# Patient Record
Sex: Female | Born: 1985 | Race: Black or African American | Hispanic: No | Marital: Single | Smoking: Never smoker
Health system: Southern US, Community
[De-identification: ages and names within clinical notes are randomized; demographics above are authoritative.]

## PROBLEM LIST (undated history)

## (undated) ENCOUNTER — Inpatient Hospital Stay (HOSPITAL_COMMUNITY): Payer: Self-pay

## (undated) DIAGNOSIS — R002 Palpitations: Secondary | ICD-10-CM

## (undated) DIAGNOSIS — Z789 Other specified health status: Secondary | ICD-10-CM

## (undated) HISTORY — PX: NO PAST SURGERIES: SHX2092

## (undated) HISTORY — DX: Palpitations: R00.2

---
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
## 2009-11-06 ENCOUNTER — Emergency Department (HOSPITAL_COMMUNITY): Admission: EM | Admit: 2009-11-06 | Discharge: 2009-11-06 | Payer: Self-pay | Admitting: Emergency Medicine

## 2009-12-12 ENCOUNTER — Emergency Department (HOSPITAL_COMMUNITY): Admission: EM | Admit: 2009-12-12 | Discharge: 2009-12-12 | Payer: Self-pay | Admitting: Family Medicine

## 2009-12-21 ENCOUNTER — Encounter: Payer: Self-pay | Admitting: Cardiology

## 2009-12-21 ENCOUNTER — Ambulatory Visit: Payer: Self-pay

## 2009-12-21 ENCOUNTER — Ambulatory Visit (HOSPITAL_COMMUNITY): Admission: RE | Admit: 2009-12-21 | Discharge: 2009-12-21 | Payer: Self-pay | Admitting: Cardiology

## 2009-12-21 ENCOUNTER — Ambulatory Visit: Payer: Self-pay | Admitting: Cardiovascular Disease

## 2010-05-31 LAB — POCT I-STAT, CHEM 8
Calcium, Ion: 1.18 mmol/L (ref 1.12–1.32)
Creatinine, Ser: 0.8 mg/dL (ref 0.4–1.2)
Glucose, Bld: 93 mg/dL (ref 70–99)
HCT: 38 % (ref 36.0–46.0)
Hemoglobin: 12.9 g/dL (ref 12.0–15.0)
Potassium: 3.8 mEq/L (ref 3.5–5.1)

## 2011-02-15 ENCOUNTER — Emergency Department (INDEPENDENT_AMBULATORY_CARE_PROVIDER_SITE_OTHER)
Admission: EM | Admit: 2011-02-15 | Discharge: 2011-02-15 | Disposition: A | Discharge status: Home / Self Care | Payer: Self-pay | Source: Home / Self Care | Attending: Family Medicine | Admitting: Family Medicine

## 2011-02-15 DIAGNOSIS — R591 Generalized enlarged lymph nodes: Secondary | ICD-10-CM

## 2011-02-15 DIAGNOSIS — R599 Enlarged lymph nodes, unspecified: Secondary | ICD-10-CM

## 2011-02-15 LAB — DIFFERENTIAL
Eosinophils Absolute: 0.1 10*3/uL (ref 0.0–0.7)
Eosinophils Relative: 1 % (ref 0–5)
Lymphs Abs: 1.8 10*3/uL (ref 0.7–4.0)
Monocytes Absolute: 0.7 10*3/uL (ref 0.1–1.0)
Monocytes Relative: 7 % (ref 3–12)

## 2011-02-15 LAB — CBC
HCT: 34.9 % — ABNORMAL LOW (ref 36.0–46.0)
Hemoglobin: 11.8 g/dL — ABNORMAL LOW (ref 12.0–15.0)
MCH: 31.1 pg (ref 26.0–34.0)
MCV: 92.1 fL (ref 78.0–100.0)
RBC: 3.79 MIL/uL — ABNORMAL LOW (ref 3.87–5.11)

## 2011-02-15 NOTE — ED Provider Notes (Signed)
History     CSN: [id number] Arrival date & time: 02/15/2011  9:10 AM   First MD Initiated Contact with Patient 02/15/11 0901      Chief Complaint  Patient presents with  . Lymphadenopathy    (Consider location/radiation/quality/duration/timing/severity/associated sxs/prior treatment) HPI Comments: Meghan Holland presents for evaluation of a swollen lymph node on her anterior neck that she noticed incidentally. She denies any recent URI or other illness, has no allergies, has not been around anyone sick. She is sexually active with one partner, but does not use barrier protection consistently. She did some online research and is just concerned at this point. She adamantly denies any sx.    Patient is a 25 y.o. female presenting with general illness. The history is provided by the patient.  Illness  The onset is undetermined. The problem has been unchanged. The problem is mild. The symptoms are relieved by nothing. The symptoms are aggravated by nothing. Pertinent negatives include no fever, no congestion, no ear pain, no rhinorrhea, no sore throat, no swollen glands and no neck pain. There were no sick contacts.    History reviewed. No pertinent past medical history.  History reviewed. No pertinent past surgical history.  No family history on file.  History  Substance Use Topics  . Smoking status: Never Smoker   . Smokeless tobacco: Not on file  . Alcohol Use: Yes     occasional    OB History    Grav Para Term Preterm Abortions TAB SAB Ect Mult Living                  Review of Systems  Constitutional: Negative for fever, chills, activity change, appetite change and unexpected weight change.  HENT: Negative for ear pain, congestion, sore throat, rhinorrhea, sneezing, trouble swallowing, neck pain and neck stiffness.   Eyes: Negative.   Respiratory: Negative.   Cardiovascular: Negative.   Gastrointestinal: Negative.   Genitourinary: Negative.   Musculoskeletal: Negative.     Skin: Negative.   Neurological: Negative.     Allergies  Review of patient's allergies indicates no known allergies.  Home Medications  No current outpatient prescriptions on file.  BP 129/83  Pulse 84  Temp(Src) 98.6 F (37 C) (Oral)  Resp 16  SpO2 99%  LMP 01/27/2011  Physical Exam  Constitutional: She is oriented to person, place, and time. She appears well-developed and well-nourished.  HENT:  Head: Normocephalic and atraumatic.  Right Ear: External ear normal.  Left Ear: External ear normal.  Mouth/Throat: Oropharynx is clear and moist.  Eyes: EOM are normal. Pupils are equal, round, and reactive to light.  Neck: Normal range of motion.  Cardiovascular: Normal rate.   Pulmonary/Chest: Effort normal and breath sounds normal.  Lymphadenopathy:    She has cervical adenopathy.  Neurological: She is alert and oriented to person, place, and time.  Skin: Skin is warm and dry.    ED Course  Procedures (including critical care time)   Labs Reviewed  HIV ANTIBODY (ROUTINE TESTING)  RPR  CBC  DIFFERENTIAL   No results found.   No diagnosis found.    MDM          Richardo Priest, MD 02/15/11 1055

## 2011-02-15 NOTE — ED Notes (Signed)
C/o swollen lymph node rt side of neck for 1 week.  Denies recent cold, illness or fever.

## 2011-02-18 NOTE — ED Notes (Signed)
The patient came in for her lab results. Pt. verified with picture ID and shown her neg. HIV result. Pt. instructed to get retested in 6 mos.  The patient voiced understanding. Vassie Moselle 02/18/2011

## 2011-02-27 ENCOUNTER — Encounter (HOSPITAL_COMMUNITY): Payer: Self-pay | Admitting: *Deleted

## 2011-02-27 ENCOUNTER — Emergency Department (HOSPITAL_COMMUNITY)
Admission: EM | Admit: 2011-02-27 | Discharge: 2011-02-27 | Disposition: A | Discharge status: Home / Self Care | Payer: Self-pay | Attending: Emergency Medicine | Admitting: Emergency Medicine

## 2011-02-27 DIAGNOSIS — R202 Paresthesia of skin: Secondary | ICD-10-CM

## 2011-02-27 DIAGNOSIS — R599 Enlarged lymph nodes, unspecified: Secondary | ICD-10-CM | POA: Insufficient documentation

## 2011-02-27 DIAGNOSIS — R209 Unspecified disturbances of skin sensation: Secondary | ICD-10-CM | POA: Insufficient documentation

## 2011-02-27 NOTE — ED Provider Notes (Signed)
History     CSN: [id number] Arrival date & time: 02/27/2011  3:18 AM   First MD Initiated Contact with Patient 02/27/11 [phone]      Chief Complaint  Patient presents with  . Tingling    (Consider location/radiation/quality/duration/timing/severity/associated sxs/prior treatment) The history is provided by the patient.   patient is a 25 year old female past medical history is negative she is followed by Dr. Sudie Bailey with complaint of on and off bilateral tingling of both facial cheeks and bilateral tingling of upper extremities usually in concert denies any numbness motor weakness headache or other neurological findings. the symptoms are present only for one week.   Also, currently taking penicillin prescribed her by her dentist for what is believed to be a tooth infection in one of the right lower teeth also patient is known and as told that she has an enlarged lymph node in that area.  History reviewed. No pertinent past medical history.  History reviewed. No pertinent past surgical history.  History reviewed. No pertinent family history.  History  Substance Use Topics  . Smoking status: Never Smoker   . Smokeless tobacco: Not on file  . Alcohol Use: Yes     occasional    OB History    Grav Para Term Preterm Abortions TAB SAB Ect Mult Living                  Review of Systems  Constitutional: Negative for fever.  HENT: Positive for dental problem. Negative for ear pain, congestion, sore throat, facial swelling and neck pain.   Eyes: Negative for photophobia and visual disturbance.  Respiratory: Negative for cough and shortness of breath.   Cardiovascular: Negative for chest pain and leg swelling.  Gastrointestinal: Negative for nausea, vomiting and abdominal pain.  Genitourinary: Negative for dysuria, frequency and hematuria.  Musculoskeletal: Negative for back pain.  Skin: Negative for rash.  Neurological: Negative for dizziness, facial asymmetry, speech difficulty,  weakness, numbness and headaches.  Hematological: Positive for adenopathy.  Psychiatric/Behavioral: Negative for confusion.    Allergies  Review of patient's allergies indicates no known allergies.  Home Medications  No current outpatient prescriptions on file.  BP 137/77  Pulse 71  Temp 98.7 F (37.1 C)  Resp 18  SpO2 100%  LMP 02/23/2011  Physical Exam  Nursing note and vitals reviewed. Constitutional: She is oriented to person, place, and time. She appears well-developed and well-nourished. No distress.  HENT:  Head: Normocephalic and atraumatic.  Mouth/Throat: Oropharynx is clear and moist.       1 cm palpable nontender right submental lymph node.  Eyes: Conjunctivae and EOM are normal. Pupils are equal, round, and reactive to light. Right eye exhibits no discharge. Left eye exhibits no discharge.  Neck: Normal range of motion. Neck supple.  Cardiovascular: Normal rate, regular rhythm and normal heart sounds.   No murmur heard. Pulmonary/Chest: Effort normal and breath sounds normal. She has no wheezes.  Abdominal: Soft. Bowel sounds are normal. There is no tenderness.  Musculoskeletal: Normal range of motion. She exhibits no edema and no tenderness.  Lymphadenopathy:    She has cervical adenopathy.  Neurological: She is alert and oriented to person, place, and time. No cranial nerve deficit. She exhibits normal muscle tone. Coordination normal.  Skin: Skin is warm. No rash noted.    ED Course  Procedures (including critical care time)  Labs Reviewed - No data to display No results found.   1. Tingling of skin  MDM   The etiology of tingling to bilateral face and both upper extremities over the last week not specifically clear. Could represent early findings of MS. This is present only for a week we'll have patient followup with her primary care provider if symptoms resolve probably not a specific concern if they reoccur or their new or worse may need  workup for MS or other evaluation.         Shelda Jakes, MD 02/27/11 [phone]

## 2011-02-27 NOTE — ED Notes (Addendum)
Pt reports tingling sensation in arms. Denies any numbness.  States "They just feel weird." Denies any injury.  Non specific on time symptoms started.  Stated "maybe last Thursday."

## 2011-03-10 ENCOUNTER — Emergency Department (HOSPITAL_COMMUNITY)
Admission: EM | Admit: 2011-03-10 | Discharge: 2011-03-10 | Disposition: A | Discharge status: Home / Self Care | Payer: Self-pay | Attending: Emergency Medicine | Admitting: Emergency Medicine

## 2011-03-10 ENCOUNTER — Encounter (HOSPITAL_COMMUNITY): Payer: Self-pay | Admitting: *Deleted

## 2011-03-10 DIAGNOSIS — M7989 Other specified soft tissue disorders: Secondary | ICD-10-CM | POA: Insufficient documentation

## 2011-03-10 DIAGNOSIS — D179 Benign lipomatous neoplasm, unspecified: Secondary | ICD-10-CM | POA: Insufficient documentation

## 2011-03-10 NOTE — ED Notes (Signed)
Pt c/o bilateral swelling to arms and legs since Monday.

## 2011-03-10 NOTE — ED Provider Notes (Signed)
History     CSN: [id number]  Arrival date & time 03/10/11  0147   First MD Initiated Contact with Patient 03/10/11 0239      Chief Complaint  Patient presents with  . Leg Swelling  . Arm Swelling    (Consider location/radiation/quality/duration/timing/severity/associated sxs/prior treatment) HPI Comments: Meghan Holland is a 25 y.o. female who presents to the Emergency Department complaining of swollen area on her legs and arm that hs has noticed over the last few days. She does not know how long she has had them. There are several to her right leg and one to her left leg. They are non tender. Denies fever, chills, shortness of breath chest pain. She is also here with intermittent numbness and tingling that she is experiencing for several weeks to her arms and legs. She was seen for a similar complaint and referred to her PCP, Dr. Sudie Bailey for follow up and further work up.   History reviewed. No pertinent past medical history.  History reviewed. No pertinent past surgical history.  History reviewed. No pertinent family history.  History  Substance Use Topics  . Smoking status: Never Smoker   . Smokeless tobacco: Not on file  . Alcohol Use: Yes     occasional    OB History    Grav Para Term Preterm Abortions TAB SAB Ect Mult Living                  Review of Systems 10 Systems reviewed and are negative for acute change except as noted in the HPI. Allergies  Review of patient's allergies indicates no known allergies.  Home Medications  No current outpatient prescriptions on file.  BP 129/78  Pulse 77  Temp 98.2 F (36.8 C)  Resp 20  Ht 5\' 8"  (1.727 m)  Wt 178 lb (80.74 kg)  BMI 27.06 kg/m2  SpO2 100%  LMP 02/23/2011  Physical Exam  Nursing note and vitals reviewed. Constitutional: She is oriented to person, place, and time. She appears well-developed and well-nourished. No distress.  HENT:  Head: Normocephalic and atraumatic.  Right Ear: External ear  normal.  Left Ear: External ear normal.  Mouth/Throat: Oropharynx is clear and moist.  Eyes: Conjunctivae and EOM are normal. Pupils are equal, round, and reactive to light.  Neck: Normal range of motion. No thyromegaly present.  Cardiovascular: Normal rate, normal heart sounds and intact distal pulses.   Pulmonary/Chest: Effort normal and breath sounds normal.  Abdominal: Soft.  Musculoskeletal: Normal range of motion.  Lymphadenopathy:    She has no cervical adenopathy.  Neurological: She is alert and oriented to person, place, and time. She has normal reflexes. No cranial nerve deficit. Coordination normal.  Skin:       palpaple asily ovable soft tissue masses < 1 cm to right thigh and     ED Course  Procedures (including critical care time)  Labs Reviewed - No data to display No results found.   1. Lipoma       MDM  Patient with h/o numbness and tingling of arms and legs that is under investigation by PCP. Here with continued intermittent numbness and tingling as well as noticing several areas to the top of her right thigh that are c/w lipomas. She will follow up with PCP, Dr. Sudie Bailey. Pt stable in ED with no significant deterioration in condition.The patient appears reasonably screened and/or stabilized for discharge and I doubt any other medical condition or other [facility] requiring further screening, evaluation, or  treatment in the ED at this time prior to discharge.  MDM Reviewed: previous chart, nursing note and vitals Reviewed previous: x-ray           Nicoletta Dress. Colon Branch, MD 03/13/11 2122

## 2011-03-22 ENCOUNTER — Emergency Department (INDEPENDENT_AMBULATORY_CARE_PROVIDER_SITE_OTHER)
Admission: EM | Admit: 2011-03-22 | Discharge: 2011-03-22 | Disposition: A | Discharge status: Home / Self Care | Payer: Self-pay | Source: Home / Self Care | Attending: Family Medicine | Admitting: Family Medicine

## 2011-03-22 ENCOUNTER — Other Ambulatory Visit: Payer: Self-pay

## 2011-03-22 ENCOUNTER — Encounter (HOSPITAL_COMMUNITY): Payer: Self-pay

## 2011-03-22 DIAGNOSIS — R002 Palpitations: Secondary | ICD-10-CM

## 2011-03-22 LAB — POCT I-STAT, CHEM 8
Creatinine, Ser: 0.9 mg/dL (ref 0.50–1.10)
Glucose, Bld: 90 mg/dL (ref 70–99)
Hemoglobin: 12.9 g/dL (ref 12.0–15.0)
TCO2: 24 mmol/L (ref 0–100)

## 2011-03-22 NOTE — ED Provider Notes (Signed)
History     CSN: [id number]  Arrival date & time 03/22/11  1029   First MD Initiated Contact with Patient 03/22/11 1111      Chief Complaint  Patient presents with  . Palpitations    (Consider location/radiation/quality/duration/timing/severity/associated sxs/prior treatment) HPI Comments: Meghan Holland presents for evaluation of increased frequency of heart palpitations over the last week. She reports a long hx of this and has seen a cardiologist in the past, with a reportedly negative workup including an echocardiogram. She denies any chest pain, shortness of breath, numbness, weakness, or vision changes.   Patient is a 26 y.o. female presenting with palpitations. The history is provided by the patient.  Palpitations  This is a chronic problem. The current episode started more than 1 week ago. The problem occurs constantly. The problem has been gradually worsening. The problem is associated with an unknown factor. Associated symptoms include irregular heartbeat. Pertinent negatives include no malaise/fatigue, no numbness, no chest pain, no chest pressure, no claudication, no exertional chest pressure, no near-syncope, no syncope, no nausea, no vomiting, no headaches, no lower extremity edema, no weakness and no shortness of breath. She has tried nothing for the symptoms. Risk factors include no known risk factors.    Past Medical History  Diagnosis Date  . Palpitations     negative echo     History reviewed. No pertinent past surgical history.  Family History  Problem Relation Age of Onset  . Hypertension Mother   . Hypertension Sister   . Diabetes Maternal Grandmother     History  Substance Use Topics  . Smoking status: Never Smoker   . Smokeless tobacco: Not on file  . Alcohol Use: Yes     occasional    OB History    Grav Para Term Preterm Abortions TAB SAB Ect Mult Living                  Review of Systems  Constitutional: Negative for malaise/fatigue.  Respiratory:  Negative for shortness of breath.   Cardiovascular: Positive for palpitations. Negative for chest pain, claudication, syncope and near-syncope.  Gastrointestinal: Negative for nausea and vomiting.  Neurological: Negative for weakness, numbness and headaches.    Allergies  Review of patient's allergies indicates no known allergies.  Home Medications  No current outpatient prescriptions on file.  BP 129/74  Pulse 72  Temp(Src) 98.4 F (36.9 C) (Oral)  Resp 18  SpO2 100%  LMP 03/21/2011  Physical Exam  Nursing note and vitals reviewed. Constitutional: She is oriented to person, place, and time. She appears well-developed and well-nourished.  HENT:  Head: Normocephalic and atraumatic.  Eyes: EOM are normal. Pupils are equal, round, and reactive to light.  Neck: Normal range of motion. Neck supple.  Cardiovascular: Normal rate, regular rhythm, normal heart sounds and normal pulses.   No murmur heard.      ECG: NSR, rate 62 bpm, no ST or T wave changes; normal PR and QT intervals  Pulmonary/Chest: Effort normal and breath sounds normal. She has no wheezes. She has no rhonchi.  Musculoskeletal: Normal range of motion.  Neurological: She is alert and oriented to person, place, and time.  Skin: Skin is warm and dry.  Psychiatric: Her behavior is normal.    ED Course  Procedures (including critical care time)  Labs Reviewed  POCT I-STAT, CHEM 8 - Abnormal; Notable for the following:    Potassium 3.4 (*)    All other components within normal limits  LAB REPORT -  SCANNED   No results found.   1. Heart palpitations       MDM  ECG: NSR, rate 62 bpm, no ST or T wave changes; normal PR and QT intervals; referred back to PCP and/or cardiologist; no intervention at this time        Richardo Priest, MD 04/20/11 [phone]

## 2011-03-22 NOTE — ED Notes (Signed)
Patient state she has been evaluated previously for palpitations ~ 1 yr ago by cardiologist , and reportedly had a negative cardiac echo . States she has palpitations off and on all the time. FOR PAST WEEK , SHE HAS HAD an increase in frequency of her palpitations  Concerned there is a heart problem, although she denies pain in chest radiation of any pain, sweating, n/v .concerned about discomfort in her right post knee area and poss. of congestive heart issues, no swelling on inspection of tibial prominance, also c/o numbness in her right leg

## 2011-03-26 ENCOUNTER — Encounter: Payer: Self-pay | Admitting: Nurse Practitioner

## 2011-03-26 ENCOUNTER — Ambulatory Visit (INDEPENDENT_AMBULATORY_CARE_PROVIDER_SITE_OTHER): Payer: Self-pay | Admitting: Nurse Practitioner

## 2011-03-26 ENCOUNTER — Telehealth: Payer: Self-pay | Admitting: *Deleted

## 2011-03-26 VITALS — BP 128/96 | HR 68 | Ht 67.0 in | Wt 178.0 lb

## 2011-03-26 DIAGNOSIS — R002 Palpitations: Secondary | ICD-10-CM | POA: Insufficient documentation

## 2011-03-26 DIAGNOSIS — E876 Hypokalemia: Secondary | ICD-10-CM

## 2011-03-26 LAB — BASIC METABOLIC PANEL
BUN: 8 mg/dL (ref 6–23)
CO2: 24 mEq/L (ref 19–32)
Calcium: 8.3 mg/dL — ABNORMAL LOW (ref 8.4–10.5)
Chloride: 108 mEq/L (ref 96–112)
Creatinine, Ser: 0.8 mg/dL (ref 0.4–1.2)
GFR: 112.55 mL/min (ref >60.00)
Glucose, Bld: 89 mg/dL (ref 70–99)
Potassium: 3.4 mEq/L — ABNORMAL LOW (ref 3.5–5.1)
Sodium: 139 mEq/L (ref 135–145)

## 2011-03-26 LAB — TSH: TSH: 1.02 u[IU]/mL (ref 0.35–5.50)

## 2011-03-26 NOTE — Assessment & Plan Note (Addendum)
We will place an event monitor. For now I have not given her any medicine to try. Would like to try and document a spell. Her echo was in 2011 and I do not think we need to update at this point. We will recheck her labs today. I will see her back in a month. She is to remain off of caffeine. Patient is agreeable to this plan and will call if any problems develop in the interim.

## 2011-03-26 NOTE — Patient Instructions (Signed)
We are going to recheck your labs today.  We are going to put a monitor on to look at your heart rhythm for the next month  I will see you in a month.  Call the [facility] office at [phone] if you have any questions, problems or concerns.

## 2011-03-26 NOTE — Progress Notes (Signed)
Addended by: Deveron Furlong on: 03/26/2011 02:21 PM   Modules accepted: Orders

## 2011-03-26 NOTE — Progress Notes (Signed)
   Meghan Holland Date of Birth: 19-Jun-1985 Medical Record #[id number]  History of Present Illness: Ms. Meghan Holland is seen today for a work in visit. She was formerly seen by Dr. Deborah Holland. She has had a past history of palpitations. She had a normal echo in October of 2011. She presents today because she was at the Urgent Care earlier this month. She had had worsening palpitations with an increase in the frequency. She was concerned that she had a heart problem. Her EKG was normal. She was told to follow up here. Her potassium was just slightly low at 3.4.   She comes in today. She still notes palpitations. She thinks something is wrong with her. Notes that she gets very dizzy and feels like she is going to pass out. Has tried to cut back her caffeine but doers eat chocolate. No frank syncope. No medical problems reported. She in on no medicines. Only occasional alcohol use.    No current outpatient prescriptions on file prior to visit.    No Known Allergies  Past Medical History  Diagnosis Date  . Palpitations     negative echo     No past surgical history on file.  History  Smoking status  . Never Smoker   Smokeless tobacco  . Not on file    History  Alcohol Use  . Yes    occasional    Family History  Problem Relation Age of Onset  . Hypertension Mother   . Hypertension Sister   . Diabetes Maternal Grandmother     Review of Systems: The review of systems is per the HPI.  All other systems were reviewed and are negative.  Physical Exam: BP 128/96  Pulse 68  Ht 5\' 7"  (1.702 m)  Wt 178 lb (80.74 kg)  BMI 27.88 kg/m2  LMP 03/21/2011 Patient is very pleasant and in no acute distress. Skin is warm and dry. Color is normal.  HEENT is unremarkable. Normocephalic/atraumatic. PERRL. Sclera are nonicteric. Neck is supple. No masses. No JVD. Lungs are clear. Cardiac exam shows a regular rate and rhythm. Abdomen is soft. Extremities are without edema. Gait and ROM are intact. No  gross neurologic deficits noted.    Lab Results  Component Value Date   WBC 9.5 02/15/2011   HGB 12.9 03/22/2011   HCT 38.0 03/22/2011   PLT 332 02/15/2011   GLUCOSE 90 03/22/2011   NA 140 03/22/2011   K 3.4* 03/22/2011   CL 105 03/22/2011   CREATININE 0.90 03/22/2011   BUN 6 03/22/2011   TSH 1.174 12/12/2009     Assessment / Plan:

## 2011-03-27 ENCOUNTER — Telehealth: Payer: Self-pay | Admitting: *Deleted

## 2011-03-27 DIAGNOSIS — R42 Dizziness and giddiness: Secondary | ICD-10-CM

## 2011-03-27 NOTE — Telephone Encounter (Signed)
FU Call: Pt returning call from our office from yesterday.   Pt also wants to be assigned to a MD, as suggested by Lori--pt is former [place] pt. Please return pt call to discuss further.

## 2011-03-27 NOTE — Telephone Encounter (Signed)
03/27/11--pt calling stating she is having very bad headaches and would like to see a neurologist--advised to call PCP for referral to neurologist--pt agrees--nt

## 2011-04-02 ENCOUNTER — Other Ambulatory Visit (INDEPENDENT_AMBULATORY_CARE_PROVIDER_SITE_OTHER): Payer: Self-pay | Admitting: *Deleted

## 2011-04-02 DIAGNOSIS — E876 Hypokalemia: Secondary | ICD-10-CM

## 2011-04-02 LAB — BASIC METABOLIC PANEL
BUN: 9 mg/dL (ref 6–23)
CO2: 26 mEq/L (ref 19–32)
Calcium: 8.8 mg/dL (ref 8.4–10.5)
Chloride: 106 mEq/L (ref 96–112)
Creatinine, Ser: 0.8 mg/dL (ref 0.4–1.2)
GFR: 115.86 mL/min (ref >60.00)
Glucose, Bld: 71 mg/dL (ref 70–99)
Potassium: 3.7 mEq/L (ref 3.5–5.1)
Sodium: 139 mEq/L (ref 135–145)

## 2011-04-04 ENCOUNTER — Telehealth: Payer: Self-pay | Admitting: Nurse Practitioner

## 2011-04-04 NOTE — Telephone Encounter (Signed)
Pt called labs satisfactory

## 2011-04-04 NOTE — Telephone Encounter (Signed)
Fu call °Patient returning your call about lab results  °

## 2011-04-26 ENCOUNTER — Ambulatory Visit: Payer: Self-pay | Admitting: Nurse Practitioner

## 2013-08-26 LAB — OB RESULTS CONSOLE GC/CHLAMYDIA
CHLAMYDIA, DNA PROBE: POSITIVE
GC PROBE AMP, GENITAL: NEGATIVE

## 2013-08-26 LAB — OB RESULTS CONSOLE RPR: RPR: NONREACTIVE

## 2013-08-28 ENCOUNTER — Inpatient Hospital Stay (HOSPITAL_COMMUNITY)
Admission: AD | Admit: 2013-08-28 | Discharge: 2013-08-28 | Disposition: A | Discharge status: Home / Self Care | Payer: Medicaid Other | Source: Ambulatory Visit | Attending: Obstetrics and Gynecology | Admitting: Obstetrics and Gynecology

## 2013-08-28 ENCOUNTER — Encounter (HOSPITAL_COMMUNITY): Payer: Self-pay | Admitting: *Deleted

## 2013-08-28 DIAGNOSIS — R1032 Left lower quadrant pain: Secondary | ICD-10-CM | POA: Insufficient documentation

## 2013-08-28 DIAGNOSIS — O2390 Unspecified genitourinary tract infection in pregnancy, unspecified trimester: Secondary | ICD-10-CM | POA: Insufficient documentation

## 2013-08-28 DIAGNOSIS — N390 Urinary tract infection, site not specified: Secondary | ICD-10-CM

## 2013-08-28 HISTORY — DX: Other specified health status: Z78.9

## 2013-08-28 LAB — URINALYSIS, ROUTINE W REFLEX MICROSCOPIC
BILIRUBIN URINE: NEGATIVE
GLUCOSE, UA: NEGATIVE mg/dL
Ketones, ur: 15 mg/dL — AB
NITRITE: NEGATIVE
PH: 6.5 (ref 5.0–8.0)
Protein, ur: NEGATIVE mg/dL
Urobilinogen, UA: 0.2 mg/dL (ref 0.0–1.0)

## 2013-08-28 LAB — URINE MICROSCOPIC-ADD ON

## 2013-08-28 MED ORDER — SULFAMETHOXAZOLE-TRIMETHOPRIM 800-160 MG PO TABS: 1.0000 | ORAL_TABLET | Freq: Two times a day (BID) | ORAL | Status: AC

## 2013-08-28 NOTE — Discharge Instructions (Signed)
Asymptomatic Bacteriuria, Female Asymptomatic bacteriuria is a significant number of bacteria in your urine that occur without the usual symptoms of burning or frequent urination. The following conditions increase risk of asymptomatic bacteriuria:  Diabetes mellitus.  Advanced age.  Pregnancy in the first trimester.  Kidney stones.  Kidney transplants.  Leaky kidney tube valve in young children (reflux). Treatment for asymptomatic bacteriuria is not required in most people and can lead to other problems such as yeast overgrowth and development of resistant bacteria. However, some people, such as pregnant women, do need treatment to prevent kidney infection. Asymptomatic bacteriuria in pregnancy is also associated with fetal growth restriction, premature labor, and newborn death. HOME CARE INSTRUCTIONS Monitor your bacteriuria for any changes. The following actions may help to alleviate any discomfort you are experiencing:  Drink enough water and fluids to keep your urine clear or pale yellow. Go to the bathroom more frequently to keep your bladder empty.  Keep the area around your vagina and rectum clean. Wipe yourself from front to back after urinating. SEEK IMMEDIATE MEDICAL CARE IF:  You develop signs of an infection such asburning with urination, frequency of voiding, back pain, or fever.  You have blood in the urine.  You develop a fever. Document Released: 03/04/2005 Document Revised: 11/04/2012 Document Reviewed: 08/24/2012 [employer] Patient Information 2014 [employer].  

## 2013-08-28 NOTE — MAU Provider Note (Signed)
History     CSN: [id number]  Arrival date and time: 08/28/13 2007   First Provider Initiated Contact with Patient 08/28/13 2028      Chief Complaint  Patient presents with  . Flank Pain   HPI Ms. Meghan Holland is a 28 y.o. G1P0 at [redacted]w[redacted]d who presents to MAU today with complaint of left sided flank pain since this morning. She states that the pain has been constant and aching. She states that it is worse with sitting, but otherwise unchanged. She also endorses radiation of pain to the LLQ. She denies UTI symptoms, hematuria, vaginal bleeding, discharge, LOF, fever, N/V/D or constipation. She states possible irregular contractions. She feels that fetal movement may be slightly decreased today.    OB History   Grav Para Term Preterm Abortions TAB SAB Ect Mult Living   1               Past Medical History  Diagnosis Date  . Palpitations     negative echo   . Medical history non-contributory     Past Surgical History  Procedure Laterality Date  . No past surgeries      Family History  Problem Relation Age of Onset  . Hypertension Mother   . Hypertension Sister   . Diabetes Maternal Grandmother     History  Substance Use Topics  . Smoking status: Never Smoker   . Smokeless tobacco: Not on file  . Alcohol Use: Yes     Comment: occasional    Allergies: No Known Allergies  Prescriptions prior to admission  Medication Sig Dispense Refill  . calcium carbonate (TUMS - DOSED IN MG ELEMENTAL CALCIUM) 500 MG chewable tablet Chew 1 tablet by mouth daily as needed for heartburn.      . Prenatal Vit-Fe Fumarate-FA (PRENATAL MULTIVITAMIN) TABS tablet Take 1 tablet by mouth daily at 12 noon.        Review of Systems  Constitutional: Negative for fever.  Gastrointestinal: Positive for abdominal pain. Negative for nausea, vomiting, diarrhea and constipation.  Genitourinary: Positive for flank pain. Negative for dysuria, urgency, frequency and hematuria.       Neg - vaginal  bleeding, discharge, LOF   Physical Exam   Blood pressure 132/72, pulse 97, temperature 98.3 F (36.8 C), temperature source Oral, resp. rate 18.  Physical Exam  Constitutional: She is oriented to person, place, and time. She appears well-developed and well-nourished. No distress.  HENT:  Head: Normocephalic.  Cardiovascular: Normal rate, regular rhythm and normal heart sounds.   Respiratory: Effort normal and breath sounds normal. No respiratory distress.  GI: Soft. She exhibits no distension and no mass. There is no tenderness. There is no rebound, no guarding and no CVA tenderness.  Musculoskeletal:       Thoracic back: She exhibits no tenderness, no bony tenderness, no swelling and no edema.       Lumbar back: She exhibits no tenderness, no swelling and no edema.  Neurological: She is alert and oriented to person, place, and time.  Skin: Skin is warm and dry. No erythema.  Psychiatric: She has a normal mood and affect.   Results for orders placed during the hospital encounter of 08/28/13 (from the past 24 hour(s))  URINALYSIS, ROUTINE W REFLEX MICROSCOPIC     Status: Abnormal   Collection Time    08/28/13  8:20 PM      Result Value Ref Range   Color, Urine YELLOW  YELLOW   APPearance CLEAR  CLEAR   Specific Gravity, Urine <1.005 (*) 1.005 - 1.030   pH 6.5  5.0 - 8.0   Glucose, UA NEGATIVE  NEGATIVE mg/dL   Hgb urine dipstick TRACE (*) NEGATIVE   Bilirubin Urine NEGATIVE  NEGATIVE   Ketones, ur 15 (*) NEGATIVE mg/dL   Protein, ur NEGATIVE  NEGATIVE mg/dL   Urobilinogen, UA 0.2  0.0 - 1.0 mg/dL   Nitrite NEGATIVE  NEGATIVE   Leukocytes, UA LARGE (*) NEGATIVE  URINE MICROSCOPIC-ADD ON     Status: Abnormal   Collection Time    08/28/13  8:20 PM      Result Value Ref Range   Squamous Epithelial / LPF FEW (*) RARE   WBC, UA 11-20  <3 WBC/hpf   Bacteria, UA FEW (*) RARE   Urine-Other RARE YEAST      Fetal Monitoring: Baseline: 155 bpm, moderate variability, +  accelerations, no decelerations Contractions: none  MAU Course  Procedures None  MDM Discussed with Dr. Willis Modena. Treat for possible UTI with Bactrim DS x 3 days. Follow-up as scheduled or sooner if symptoms worsen  Assessment and Plan  A: SIUP at [redacted]w[redacted]d UTI  P: Discharge home Rx for Bactrim given to patient Patient advised to follow-up with [facility] as scheduled or sooner if symptoms persist or worsen Patient may return to MAU as needed or if her condition were to change or worsen  Farris Has, PA-C  08/28/2013, 9:06 PM

## 2013-08-28 NOTE — MAU Note (Addendum)
Left flank pain that wraps around to left mid abdomen since this morning. Constant. Denies dysuria. Denies vaginal bleeding or LOF. Decreased fetal movement today.

## 2013-08-28 NOTE — MAU Note (Signed)
L back pain off and on few days but much worse today. Denies bleeding or leaking fld

## 2013-08-29 NOTE — Progress Notes (Signed)
FHT from 6-13 reviewed.  Reactive NST, no significant decels, no regular ctx.

## 2013-10-26 ENCOUNTER — Other Ambulatory Visit: Payer: Self-pay | Admitting: Obstetrics and Gynecology

## 2013-10-26 ENCOUNTER — Telehealth (HOSPITAL_COMMUNITY): Payer: Self-pay | Admitting: *Deleted

## 2013-10-26 LAB — OB RESULTS CONSOLE GBS: GBS: POSITIVE

## 2013-10-26 NOTE — Telephone Encounter (Signed)
Preadmission screen  

## 2013-10-27 ENCOUNTER — Inpatient Hospital Stay (HOSPITAL_COMMUNITY)
Admission: RE | Admit: 2013-10-27 | Discharge: 2013-10-29 | DRG: 775 | Disposition: A | Discharge status: Home / Self Care | Payer: Medicaid Other | Source: Ambulatory Visit | Attending: Obstetrics and Gynecology | Admitting: Obstetrics and Gynecology

## 2013-10-27 ENCOUNTER — Encounter (HOSPITAL_COMMUNITY): Payer: Self-pay

## 2013-10-27 ENCOUNTER — Inpatient Hospital Stay (HOSPITAL_COMMUNITY): Payer: Medicaid Other | Admitting: Anesthesiology

## 2013-10-27 ENCOUNTER — Encounter (HOSPITAL_COMMUNITY): Payer: Medicaid Other | Admitting: Anesthesiology

## 2013-10-27 VITALS — BP 118/65 | HR 76 | Temp 98.2°F | Resp 20 | Ht 69.0 in | Wt 199.8 lb

## 2013-10-27 DIAGNOSIS — O365990 Maternal care for other known or suspected poor fetal growth, unspecified trimester, not applicable or unspecified: Secondary | ICD-10-CM | POA: Diagnosis present

## 2013-10-27 DIAGNOSIS — Z833 Family history of diabetes mellitus: Secondary | ICD-10-CM | POA: Diagnosis not present

## 2013-10-27 DIAGNOSIS — Z803 Family history of malignant neoplasm of breast: Secondary | ICD-10-CM | POA: Diagnosis not present

## 2013-10-27 DIAGNOSIS — O99892 Other specified diseases and conditions complicating childbirth: Secondary | ICD-10-CM | POA: Diagnosis present

## 2013-10-27 DIAGNOSIS — Z3490 Encounter for supervision of normal pregnancy, unspecified, unspecified trimester: Secondary | ICD-10-CM

## 2013-10-27 DIAGNOSIS — Z22330 Carrier of Group B streptococcus: Secondary | ICD-10-CM

## 2013-10-27 DIAGNOSIS — O99891 Other specified diseases and conditions complicating pregnancy: Secondary | ICD-10-CM | POA: Diagnosis present

## 2013-10-27 DIAGNOSIS — O9989 Other specified diseases and conditions complicating pregnancy, childbirth and the puerperium: Secondary | ICD-10-CM

## 2013-10-27 DIAGNOSIS — R002 Palpitations: Secondary | ICD-10-CM

## 2013-10-27 LAB — SAMPLE TO BLOOD BANK

## 2013-10-27 LAB — OB RESULTS CONSOLE ANTIBODY SCREEN: Antibody Screen: NEGATIVE

## 2013-10-27 LAB — CBC
HEMATOCRIT: 33 % — AB (ref 36.0–46.0)
Hemoglobin: 11.3 g/dL — ABNORMAL LOW (ref 12.0–15.0)
MCH: 31.8 pg (ref 26.0–34.0)
MCHC: 34.2 g/dL (ref 30.0–36.0)
MCV: 93 fL (ref 78.0–100.0)
Platelets: 316 10*3/uL (ref 150–400)
RBC: 3.55 MIL/uL — AB (ref 3.87–5.11)
RDW: 13 % (ref 11.5–15.5)
WBC: 13.3 10*3/uL — AB (ref 4.0–10.5)

## 2013-10-27 LAB — OB RESULTS CONSOLE RUBELLA ANTIBODY, IGM: Rubella: IMMUNE

## 2013-10-27 LAB — RPR

## 2013-10-27 LAB — OB RESULTS CONSOLE ABO/RH: RH TYPE: POSITIVE

## 2013-10-27 LAB — OB RESULTS CONSOLE HEPATITIS B SURFACE ANTIGEN: Hepatitis B Surface Ag: NEGATIVE

## 2013-10-27 LAB — OB RESULTS CONSOLE HIV ANTIBODY (ROUTINE TESTING): HIV: NONREACTIVE

## 2013-10-27 MED ORDER — LACTATED RINGERS IV SOLN
500.0000 mL | INTRAVENOUS | Status: DC | PRN
  Administered 2013-10-27: 500 mL via INTRAVENOUS

## 2013-10-27 MED ORDER — PENICILLIN G POTASSIUM 5000000 UNITS IJ SOLR
5.0000 10*6.[IU] | Freq: Once | INTRAMUSCULAR | Status: AC
  Administered 2013-10-27: 5 10*6.[IU] via INTRAVENOUS
  Filled 2013-10-27: qty 5

## 2013-10-27 MED ORDER — DIPHENHYDRAMINE HCL 50 MG/ML IJ SOLN: 12.5000 mg | INTRAMUSCULAR | Status: DC | PRN

## 2013-10-27 MED ORDER — BENZOCAINE-MENTHOL 20-0.5 % EX AERO
1.0000 "application " | INHALATION_SPRAY | CUTANEOUS | Status: DC | PRN
  Administered 2013-10-28: 1 via TOPICAL
  Filled 2013-10-27: qty 56

## 2013-10-27 MED ORDER — ONDANSETRON HCL 4 MG PO TABS: 4.0000 mg | ORAL_TABLET | ORAL | Status: DC | PRN

## 2013-10-27 MED ORDER — WITCH HAZEL-GLYCERIN EX PADS: 1.0000 "application " | MEDICATED_PAD | CUTANEOUS | Status: DC | PRN

## 2013-10-27 MED ORDER — OXYTOCIN 40 UNITS IN LACTATED RINGERS INFUSION - SIMPLE MED
1.0000 m[IU]/min | INTRAVENOUS | Status: DC
  Administered 2013-10-27: 12 m[IU]/min via INTRAVENOUS
  Administered 2013-10-27: 2 m[IU]/min via INTRAVENOUS
  Filled 2013-10-27: qty 1000

## 2013-10-27 MED ORDER — OXYCODONE-ACETAMINOPHEN 5-325 MG PO TABS: 1.0000 | ORAL_TABLET | ORAL | Status: DC | PRN

## 2013-10-27 MED ORDER — DIPHENHYDRAMINE HCL 25 MG PO CAPS: 25.0000 mg | ORAL_CAPSULE | Freq: Four times a day (QID) | ORAL | Status: DC | PRN

## 2013-10-27 MED ORDER — IBUPROFEN 600 MG PO TABS
600.0000 mg | ORAL_TABLET | Freq: Four times a day (QID) | ORAL | Status: DC
  Administered 2013-10-27 – 2013-10-29 (×7): 600 mg via ORAL
  Filled 2013-10-27 (×6): qty 1

## 2013-10-27 MED ORDER — MEASLES, MUMPS & RUBELLA VAC ~~LOC~~ INJ
0.5000 mL | INJECTION | Freq: Once | SUBCUTANEOUS | Status: DC
  Filled 2013-10-27: qty 0.5

## 2013-10-27 MED ORDER — TETANUS-DIPHTH-ACELL PERTUSSIS 5-2.5-18.5 LF-MCG/0.5 IM SUSP
0.5000 mL | Freq: Once | INTRAMUSCULAR | Status: AC
  Administered 2013-10-28: 0.5 mL via INTRAMUSCULAR

## 2013-10-27 MED ORDER — LIDOCAINE HCL (PF) 1 % IJ SOLN
30.0000 mL | INTRAMUSCULAR | Status: AC | PRN
  Administered 2013-10-27: 30 mL via SUBCUTANEOUS
  Filled 2013-10-27: qty 30

## 2013-10-27 MED ORDER — DIBUCAINE 1 % RE OINT: 1.0000 "application " | TOPICAL_OINTMENT | RECTAL | Status: DC | PRN

## 2013-10-27 MED ORDER — FERROUS FUMARATE 325 (106 FE) MG PO TABS
1.0000 | ORAL_TABLET | Freq: Every day | ORAL | Status: DC
  Administered 2013-10-28: 106 mg via ORAL
  Filled 2013-10-27 (×3): qty 1

## 2013-10-27 MED ORDER — SENNOSIDES-DOCUSATE SODIUM 8.6-50 MG PO TABS
2.0000 | ORAL_TABLET | ORAL | Status: DC
  Administered 2013-10-28 (×2): 2 via ORAL
  Filled 2013-10-27: qty 2

## 2013-10-27 MED ORDER — PENICILLIN G POTASSIUM 5000000 UNITS IJ SOLR
2.5000 10*6.[IU] | INTRAVENOUS | Status: DC
  Administered 2013-10-27: 2.5 10*6.[IU] via INTRAVENOUS
  Filled 2013-10-27 (×5): qty 2.5

## 2013-10-27 MED ORDER — SIMETHICONE 80 MG PO CHEW: 80.0000 mg | CHEWABLE_TABLET | ORAL | Status: DC | PRN

## 2013-10-27 MED ORDER — LACTATED RINGERS IV SOLN: 500.0000 mL | Freq: Once | INTRAVENOUS | Status: DC

## 2013-10-27 MED ORDER — ONDANSETRON HCL 4 MG/2ML IJ SOLN: 4.0000 mg | INTRAMUSCULAR | Status: DC | PRN

## 2013-10-27 MED ORDER — EPHEDRINE 5 MG/ML INJ
10.0000 mg | INTRAVENOUS | Status: DC | PRN
  Filled 2013-10-27: qty 2

## 2013-10-27 MED ORDER — LACTATED RINGERS IV SOLN: INTRAVENOUS | Status: AC

## 2013-10-27 MED ORDER — OXYTOCIN 40 UNITS IN LACTATED RINGERS INFUSION - SIMPLE MED: 62.5000 mL/h | INTRAVENOUS | Status: AC | PRN

## 2013-10-27 MED ORDER — PRENATAL MULTIVITAMIN CH
1.0000 | ORAL_TABLET | Freq: Every day | ORAL | Status: DC
  Administered 2013-10-28: 1 via ORAL

## 2013-10-27 MED ORDER — IBUPROFEN 600 MG PO TABS: 600.0000 mg | ORAL_TABLET | Freq: Four times a day (QID) | ORAL | Status: DC | PRN

## 2013-10-27 MED ORDER — LIDOCAINE HCL (PF) 1 % IJ SOLN
INTRAMUSCULAR | Status: DC | PRN
  Administered 2013-10-27: 8 mL
  Administered 2013-10-27: 9 mL

## 2013-10-27 MED ORDER — LACTATED RINGERS IV SOLN
INTRAVENOUS | Status: DC
  Administered 2013-10-27 (×2): via INTRAVENOUS

## 2013-10-27 MED ORDER — ZOLPIDEM TARTRATE 5 MG PO TABS: 5.0000 mg | ORAL_TABLET | Freq: Every evening | ORAL | Status: DC | PRN

## 2013-10-27 MED ORDER — PRENATAL MULTIVITAMIN CH
1.0000 | ORAL_TABLET | Freq: Every day | ORAL | Status: DC
  Filled 2013-10-27: qty 1

## 2013-10-27 MED ORDER — FENTANYL 2.5 MCG/ML BUPIVACAINE 1/10 % EPIDURAL INFUSION (WH - ANES)
INTRAMUSCULAR | Status: DC | PRN
  Administered 2013-10-27: 14 mL/h via EPIDURAL

## 2013-10-27 MED ORDER — PHENYLEPHRINE 40 MCG/ML (10ML) SYRINGE FOR IV PUSH (FOR BLOOD PRESSURE SUPPORT)
80.0000 ug | PREFILLED_SYRINGE | INTRAVENOUS | Status: DC | PRN
  Filled 2013-10-27: qty 10
  Filled 2013-10-27: qty 2

## 2013-10-27 MED ORDER — OXYTOCIN 40 UNITS IN LACTATED RINGERS INFUSION - SIMPLE MED: 62.5000 mL/h | INTRAVENOUS | Status: DC

## 2013-10-27 MED ORDER — FENTANYL 2.5 MCG/ML BUPIVACAINE 1/10 % EPIDURAL INFUSION (WH - ANES)
14.0000 mL/h | INTRAMUSCULAR | Status: DC | PRN
Start: 2013-10-27 — End: 2013-10-27
  Administered 2013-10-27: 14 mL/h via EPIDURAL
  Filled 2013-10-27: qty 125

## 2013-10-27 MED ORDER — LANOLIN HYDROUS EX OINT: TOPICAL_OINTMENT | CUTANEOUS | Status: DC | PRN

## 2013-10-27 MED ORDER — OXYTOCIN BOLUS FROM INFUSION: 500.0000 mL | INTRAVENOUS | Status: DC

## 2013-10-27 MED ORDER — PHENYLEPHRINE 40 MCG/ML (10ML) SYRINGE FOR IV PUSH (FOR BLOOD PRESSURE SUPPORT)
80.0000 ug | PREFILLED_SYRINGE | INTRAVENOUS | Status: DC | PRN
  Filled 2013-10-27: qty 2

## 2013-10-27 NOTE — Progress Notes (Signed)
Patient ID: Meghan Holland, female   DOB: 1985/05/18, 28 y.o.   MRN: [id number] At 12:30 PM AROM  was done with clear fluid. The contractions are q 2 minutes but are not painful. The cervix is 4 cm 70% effaced and the vertex is at - 2 station. FHT's are normal. After AROM there were variable decelerations 51f 10-20 BPM with good recovery

## 2013-10-27 NOTE — Progress Notes (Signed)
Patient ID: Meghan Holland, female   DOB: 06-Aug-1985, 28 y.o.   MRN: [id number] Delivery note:  After the epidural the FHR was much less abnormal and the pt progressed to full dilatation. After a period of time without pushing the pt was asked to push and was able to move the vertex to crowning and delivered spontaneously LOA a living female infant with Apgars of 8 and 9 at 1 and 5 minutes. The placenta delivered intact and the uterus was normal. There was an irregular second degree ML laceration that was repaired with 3-0 vicryl under local block with 1% xylocaine EBL 300 cc's

## 2013-10-27 NOTE — Progress Notes (Signed)
Pushing

## 2013-10-27 NOTE — Anesthesia Procedure Notes (Signed)
Epidural Patient location during procedure: OB Start time: 10/27/2013 1:41 PM End time: 10/27/2013 1:45 PM  Staffing Anesthesiologist: Lyn Hollingshead Performed by: anesthesiologist   Preanesthetic Checklist Completed: patient identified, surgical consent, pre-op evaluation, timeout performed, IV checked, risks and benefits discussed and monitors and equipment checked  Epidural Patient position: sitting Prep: site prepped and draped and DuraPrep Patient monitoring: continuous pulse ox and blood pressure Approach: midline Location: L3-L4 Injection technique: LOR air  Needle:  Needle type: Tuohy  Needle gauge: 17 G Needle length: 9 cm and 9 Needle insertion depth: 6 cm Catheter type: closed end flexible Catheter size: 19 Gauge Catheter at skin depth: 11 cm Test dose: negative and Other  Assessment Sensory level: T9 Events: blood not aspirated, injection not painful, no injection resistance, negative IV test and no paresthesia  Additional Notes Reason for block:procedure for pain

## 2013-10-27 NOTE — H&P (Signed)
NAME:  Meghan Holland, Meghan Holland                    ACCOUNT NO.:  MEDICAL RECORD NO.:  [id number]  LOCATION:                                 FACILITY:  PHYSICIAN:  Lucille Passy. Ulanda Edison, M.D. DATE OF BIRTH:  1985/07/21  DATE OF ADMISSION: DATE OF DISCHARGE:                             HISTORY & PHYSICAL   PRESENT ILLNESS:  This is a 28 year old black female, para 0, gravida 1, EDC by late ultrasound at 32 weeks, October 29, 2013.  The patient is admitted for induction because she has a very favorable cervix and the baby seems to be exhibiting poor growth and is being followed with nonstress test.  Blood group and type is B positive with a negative antibody, RPR negative, HIV negative.  GC and chlamydia initially, the chlamydia was positive.  Initial hemoglobin was 9.0, hematocrit 27.2. Hepatitis B surface antigen was negative.  Rubella was negative.  One hour Glucola 65.  Group B strep was positive.  GC and chlamydia at 36 weeks both negative.  The patient began her prenatal course at our office on July 09, 2013.  By her menstrual history, her due date was September 28, 2013.  However, with her first ultrasound, her due date was estimated at October 29, 2013, 24 weeks and 0 days.  The anatomy was incomplete, so a repeat ultrasound was done on June 1st, showing appropriate growth in the interim, estimated fetal weight in the 46th percentile.  An ultrasound on October 07, 2013 because of measuring small for dates, baby was estimated to be in the 16th percentile.  The head circumference was less than the third percentile.  The abdominal circumference less than the 4th percentile.  Overall estimated weight in the 16th percentile.  Dopplers were negative.  The patient has been monitored with nonstress test.  Her cervix is 3-4 cm dilated.  There is some possibility that the baby could be earlier than her estimated gestational age by the first ultrasound, but because of poor growth, I want to go ahead and deliver  the baby.  PAST MEDICAL HISTORY:  Reveals that she has had a tachycardia, but her echo was normal.  PAST SURGICAL HISTORY:  None.  ALLERGIES:  No known drug allergies.  No latex allergy.  No food allergy.  FAMILY HISTORY:  Maternal grandmother with cancer of the breast. Maternal aunt with some type of cancer.  Maternal grandmother, diabetes. Mother with high blood pressure and sister with high blood pressure. The patient has never been a smoker.  Drank occasionally.  No drugs.  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  PHYSICAL EXAMINATION:  HEART:  Normal size and sounds. LUNGS:  Clear to auscultation. VITAL SIGNS:  Blood pressure 122/68.  Fetal heart tones normal. GU:  Cervix 3-4 cm, 40%, vertex at -2.  Fundal height only 34 cm.  ADMITTING IMPRESSION:  Intrauterine pregnancy at 39+ weeks with late entry into prenatal care.  Ultrasound done at 24 weeks, another ultrasound at 28 weeks showed good growth.  Ultrasound done 2 weeks ago showed poor growth.  Doppler was normal.  Abdominal circumference and head circumference low.  I feel  even though the patient had a late entry into prenatal care and late ultrasound since her ultrasound predicted, she was 24 weeks and her menstrual history suggested 28 with the poor fetal growth, late in pregnancy that induction of labor is justified.     Lucille Passy. Ulanda Edison, M.D.     TFH/MEDQ  D:  10/26/2013  T:  10/26/2013  Job:  [id number]

## 2013-10-27 NOTE — Anesthesia Preprocedure Evaluation (Signed)

## 2013-10-27 NOTE — Progress Notes (Signed)
Patient ID: Meghan Holland, female   DOB: 04-10-1985, 28 y.o.   MRN: [id number] Pt admitted for induction because of suspected IUGR and 39 weeks 5 days by a 24 week [place]. I explained to the pt that I think the baby should be delivered but there is a chance the GA could be off by 2 weeks. The cervix is 4 cm 50 % effaced. The vertex is at - 2 station and there is bloody show with the exam

## 2013-10-27 NOTE — Progress Notes (Signed)
Patient ID: Meghan Holland, female   DOB: 1985-07-11, 28 y.o.   MRN: [id number] Variable decelerations persist. The pitocin rate was decreased and then the pitocin was stopped when the variables persisted. O2 by mask was started and pt was always in lateral position. The cervix feels 5 cm 80% effaced and the vertex is at - 2 station.She requests and is going to receive an epidural

## 2013-10-27 NOTE — Progress Notes (Signed)
Pushing effectively heart rate of infant at times is not audible. After pushing able to obtain heart rate.

## 2013-10-28 LAB — CBC
HCT: 28.1 % — ABNORMAL LOW (ref 36.0–46.0)
Hemoglobin: 9.3 g/dL — ABNORMAL LOW (ref 12.0–15.0)
MCH: 30.9 pg (ref 26.0–34.0)
MCHC: 33.1 g/dL (ref 30.0–36.0)
MCV: 93.4 fL (ref 78.0–100.0)
Platelets: 259 10*3/uL (ref 150–400)
RBC: 3.01 MIL/uL — AB (ref 3.87–5.11)
RDW: 13.1 % (ref 11.5–15.5)
WBC: 14 10*3/uL — ABNORMAL HIGH (ref 4.0–10.5)

## 2013-10-28 NOTE — Anesthesia Postprocedure Evaluation (Signed)
  Anesthesia Post-op Note  Patient: Meghan Holland  Procedure(s) Performed: * No procedures listed *  Patient Location: Mother/Baby  Anesthesia Type:Epidural  Level of Consciousness: awake  Airway and Oxygen Therapy: Patient Spontanous Breathing  Post-op Pain: none  Post-op Assessment: Patient's Cardiovascular Status Stable, Respiratory Function Stable, Patent Airway, No signs of Nausea or vomiting, Adequate PO intake, Pain level controlled, No headache, No backache, No residual numbness and No residual motor weakness  Post-op Vital Signs: Reviewed and stable  Last Vitals:  Filed Vitals:   10/28/13 0746  BP: 118/64  Pulse: 67  Temp: 36.7 C  Resp: 16    Complications: No apparent anesthesia complications

## 2013-10-28 NOTE — Progress Notes (Signed)
Patient ID: Meghan Holland, female   DOB: 04/19/1985, 28 y.o.   MRN: [id number] #1 afebrile BP normal no complaints

## 2013-10-29 MED ORDER — IBUPROFEN 600 MG PO TABS: 600.0000 mg | ORAL_TABLET | Freq: Four times a day (QID) | ORAL | Status: DC | PRN

## 2013-10-29 NOTE — Progress Notes (Signed)
Patient ID: Meghan Holland, female   DOB: Sep 27, 1985, 28 y.o.   MRN: [id number] #2 afebrile BP normal for d/c

## 2013-10-29 NOTE — Discharge Instructions (Signed)
booklet °

## 2013-10-29 NOTE — Discharge Summary (Signed)
Meghan Holland, Meghan Holland NO.:  [id number]  MEDICAL RECORD NO.:  [id number]  LOCATION:  0737                          FACILITY:  [place]  PHYSICIAN:  Lucille Passy. Ulanda Edison, M.D. DATE OF BIRTH:  13-Jul-1985  DATE OF ADMISSION:  10/27/2013 DATE OF DISCHARGE:  10/29/2013                              DISCHARGE SUMMARY   HOSPITAL COURSE:  This is a 28 year old black female, para 0, gravida 1, who was came late to care at 24 weeks.  The patient is admitted for induction of labor because she had a very favorable cervix and the baby seemed to be exhibiting poor growth being followed with nonstress tests. Blood group and type B positive, negative antibody, RPR and HIV negative.  Initially, her chlamydia was positive.  Initial hemoglobin was 9.0.  Hepatitis B surface antigen negative.  Rubella negative.  One- hour Glucola 65.  Group B strep was positive.  The patient had an ultrasound at 24 weeks and again at 28 weeks showing good growth but on July 23, she measured small.  Baby was estimated by ultrasound to be in the 16th percentile.  Head circumference was less than the 3rd percentile.  Abdominal circumference less than the 4th percentile. Dopplers were negative.  The patient was monitored with nonstress test. Her cervix was 3-4 cm dilated.  I explained to the patient she could be earlier than her estimated due date based on the first ultrasound, but because of poor growth, I felt like delivery was indicated.  She was admitted to the hospital, placed on Pitocin and penicillin.  Cervix was 4 cm, 50% effaced, vertex at a -2 with some bloody show on admission. At 12:30 p.m., artificial rupture of the membranes was done with clear fluid.  Cervix was 4 cm, 70%.  After the artificial rupture of the membranes, variable deceleration of 10 to 20 beats per minute were noted with good recovery.  The patient requested and received an epidural. Post Pitocin rate was decreased initially and then  the Pitocin was stopped when the variables persisted.  Oxygen was started by mask.  The patient was always positioned properly.  At 01:38 p.m., the cervix was 5 cm, 80%, vertex at a -2.  After the epidural, the fetal heart rate was much less than normal.  The patient progressed to full dilatation. After a period of time without pushing, the patient was asked to push and was able to move the vertex to crowning and delivered spontaneously LOA a living female infant with Apgars of 8 and 9 at 1 and 5 minutes. Placenta delivered intact.  Uterus was normal.  Irregular second-degree midline laceration was repaired with 3-0 Vicryl under local block with 1% Xylocaine.  Blood loss was about 300 mL.  Dr. Ulanda Edison was in attendance.  Postpartum, the patient did well and was discharged on the second postpartum day.  Initial hemoglobin was 11.3, hematocrit 33, white count 13,300, platelet count 316,000.  Followup hemoglobin 9.3.  FINAL DIAGNOSES:  Intrauterine pregnancy at term by late ultrasound, suspected intrauterine growth restriction.  OPERATION:  Spontaneous delivery LOA, repair of second-degree midline laceration.  FINAL CONDITION:  Improved.  INSTRUCTIONS:  Include our regular discharge  instruction booklet and after visit summary.  The patient declines Percocet.  She is advised to continue her prenatal vitamins and iron and take Motrin 600 mg 30 tablets, 1 every 6 hours as needed for pain.  She is to return to our office in 6 weeks for followup examination.     Lucille Passy. Ulanda Edison, M.D.     TFH/MEDQ  D:  10/29/2013  T:  10/29/2013  Job:  [id number]

## 2013-10-29 NOTE — Lactation Note (Signed)
This note was copied from the chart of Meghan Holland. Lactation Consultation Note  Patient Name: Meghan Holland [id number] Date: 10/29/2013   Mother reports that baby is breastfeeding well and denies any concerns with latching or discomfort. Mother has questions related to formula feeding combined with breastfeeding. Discussed supply and demand and the need for baby to breast feed frequent in order to initiate and maintain milk supply. Mom encouraged to feed baby 8-12 times/24 hours and with feeding cues. Discussed the benefits of exclusive breastfeeding for her and the baby. Mother voices that she wants to both breast feed and formula feed. Although discouraged for reasons listed, praise given to mother for providing her baby with any breast milk. Discussed pumping as an option for bottle feeding expressed milk. Mom made aware of O/P services, breastfeeding support groups, community resources, and our phone # for post-discharge questions.   Maternal Data    Feeding    LATCH Score/Interventions                      Lactation Tools Discussed/Used     Consult Status      Meghan Holland 10/29/2013, 12:16 PM

## 2014-01-12 ENCOUNTER — Other Ambulatory Visit: Payer: Self-pay | Admitting: Obstetrics and Gynecology

## 2014-01-17 ENCOUNTER — Encounter (HOSPITAL_COMMUNITY): Payer: Self-pay

## 2014-01-19 ENCOUNTER — Encounter (HOSPITAL_COMMUNITY): Payer: Self-pay | Admitting: *Deleted

## 2014-02-02 ENCOUNTER — Ambulatory Visit (HOSPITAL_COMMUNITY)
Admission: RE | Admit: 2014-02-02 | Payer: Medicaid Other | Source: Ambulatory Visit | Admitting: Obstetrics and Gynecology

## 2014-02-02 SURGERY — CONE BIOPSY, CERVIX
Anesthesia: General

## 2014-02-23 ENCOUNTER — Ambulatory Visit (INDEPENDENT_AMBULATORY_CARE_PROVIDER_SITE_OTHER): Payer: Medicaid Other | Admitting: Nurse Practitioner

## 2014-02-23 ENCOUNTER — Encounter: Payer: Self-pay | Admitting: Nurse Practitioner

## 2014-02-23 VITALS — BP 112/70 | HR 79 | Ht 68.0 in | Wt 178.8 lb

## 2014-02-23 DIAGNOSIS — R002 Palpitations: Secondary | ICD-10-CM

## 2014-02-23 NOTE — Patient Instructions (Signed)
Your EKG looks good  Try Advil or Aleve for a couple of days  Ok to use OTC prilosec daily for 2 weeks  I will see you back as needed  Call the [place] office at [phone] if you have any questions, problems or concerns.

## 2014-02-23 NOTE — Progress Notes (Signed)
Meghan Holland Date of Birth: November 26, 1985 Medical Record #[id number]  History of Present Illness: Meghan Holland is seen today for a follow up visit. She was formerly seen by Dr. Doreatha Lew. She has had a past history of palpitations. She had a normal echo in October of 2011.   She was seen as a work in back in January of 2013 for palpitations. Has not been seen since.   She comes in today. Here alone. Has done fine since she was last here. Delivered a baby girl back at the end of the summer. No palpitations. 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 She has had over the last week or so a fleeting pain in her upper left chest - like a cramp - not exertional. No associated symptoms but has some belching/burping. This is very fleeting - only lasts for a second. No heart disease in her family. No smoking history. No HTN and had no issues with her childbirth.    Current Outpatient Prescriptions  Medication Sig Dispense Refill  . ferrous fumarate (HEMOCYTE - 106 MG FE) 325 (106 FE) MG TABS tablet Take 1 tablet by mouth daily.     . Prenatal Vit-Fe Fumarate-FA (SE-NATAL 19) 29-1 MG CHEW   12   No current facility-administered medications for this visit.    No Known Allergies  Past Medical History  Diagnosis Date  . Palpitations     negative echo   . Medical history non-contributory     Past Surgical History  Procedure Laterality Date  . No past surgeries      History  Smoking status  . Never Smoker   Smokeless tobacco  . Never Used    History  Alcohol Use No    Comment: occasional    Family History  Problem Relation Age of Onset  . Hypertension Mother   . Hypertension Sister   . Diabetes Maternal Grandmother   . Cancer Maternal Grandmother     malignant breast tumor    Review of Systems: The review of systems is per the HPI.  All other systems were reviewed and are negative.  Physical Exam: BP 112/70 mmHg  Pulse 79  Ht 5\' 8"  (1.727 m)  Wt 178 lb 12.8 oz  (81.103 kg)  BMI 27.19 kg/m2 Patient is very pleasant and in no acute distress. Weight going back down since delivery. Skin is warm and dry. Color is normal.  HEENT is unremarkable. Normocephalic/atraumatic. PERRL. Sclera are nonicteric. Neck is supple. No masses. No JVD. Lungs are clear. Cardiac exam shows a regular rate and rhythm. Abdomen is soft. Extremities are without edema. Gait and ROM are intact. No gross neurologic deficits noted.  Wt Readings from Last 3 Encounters:  02/23/14 178 lb 12.8 oz (81.103 kg)  10/27/13 199 lb 12.8 oz (90.629 kg)  03/26/11 178 lb (80.74 kg)    LABORATORY DATA/PROCEDURES:  EKG today shows NSR - normal.  Lab Results  Component Value Date   WBC 14.0* 10/28/2013   HGB 9.3* 10/28/2013   HCT 28.1* 10/28/2013   PLT 259 10/28/2013   GLUCOSE 71 04/02/2011   NA 139 04/02/2011   K 3.7 04/02/2011   CL 106 04/02/2011   CREATININE 0.8 04/02/2011   BUN 9 04/02/2011   CO2 26 04/02/2011   TSH 1.02 03/26/2011    BNP (last 3 results) No results for input(s): PROBNP in the last 8760 hours.   Assessment / Plan:  1. Atypical chest pain - not felt  to be cardiac - would try Advil/Aleve for a few days and can also do some OTC prilosec.   2. Palpitations - not an issue  See back prn.   Patient is agreeable to this plan and will call if any problems develop in the interim.   Burtis Junes, RN, [place] [address] [place], [place]  [id number] [phone]

## 2014-02-27 ENCOUNTER — Emergency Department (HOSPITAL_COMMUNITY): Payer: Medicaid Other

## 2014-02-27 ENCOUNTER — Encounter (HOSPITAL_COMMUNITY): Payer: Self-pay | Admitting: Emergency Medicine

## 2014-02-27 ENCOUNTER — Emergency Department (HOSPITAL_COMMUNITY)
Admission: EM | Admit: 2014-02-27 | Discharge: 2014-02-28 | Disposition: A | Discharge status: Home / Self Care | Payer: Medicaid Other | Attending: Emergency Medicine | Admitting: Emergency Medicine

## 2014-02-27 ENCOUNTER — Other Ambulatory Visit: Payer: Self-pay

## 2014-02-27 DIAGNOSIS — Z79899 Other long term (current) drug therapy: Secondary | ICD-10-CM | POA: Insufficient documentation

## 2014-02-27 DIAGNOSIS — R079 Chest pain, unspecified: Secondary | ICD-10-CM

## 2014-02-27 LAB — CBC
HCT: 36.8 % (ref 36.0–46.0)
Hemoglobin: 11.9 g/dL — ABNORMAL LOW (ref 12.0–15.0)
MCH: 29.8 pg (ref 26.0–34.0)
MCHC: 32.3 g/dL (ref 30.0–36.0)
MCV: 92 fL (ref 78.0–100.0)
PLATELETS: 316 10*3/uL (ref 150–400)
RBC: 4 MIL/uL (ref 3.87–5.11)
RDW: 12.1 % (ref 11.5–15.5)
WBC: 6.6 10*3/uL (ref 4.0–10.5)

## 2014-02-27 LAB — I-STAT TROPONIN, ED: TROPONIN I, POC: 0 ng/mL (ref 0.00–0.08)

## 2014-02-27 LAB — BASIC METABOLIC PANEL
Anion gap: 10 (ref 5–15)
BUN: 7 mg/dL (ref 6–23)
CALCIUM: 9.1 mg/dL (ref 8.4–10.5)
CO2: 25 mEq/L (ref 19–32)
Chloride: 104 mEq/L (ref 96–112)
Creatinine, Ser: 0.83 mg/dL (ref 0.50–1.10)
GFR calc non Af Amer: >90 mL/min (ref >90)
Glucose, Bld: 85 mg/dL (ref 70–99)
Potassium: 3.5 mEq/L — ABNORMAL LOW (ref 3.7–5.3)
SODIUM: 139 meq/L (ref 137–147)

## 2014-02-27 MED ORDER — NAPROXEN 500 MG PO TABS: 500.0000 mg | ORAL_TABLET | Freq: Two times a day (BID) | ORAL | Status: DC

## 2014-02-27 NOTE — ED Notes (Signed)
Pt arrived to the ED with a complaint of chest pain.  Pt states she has had a dull achy pain located central chest.  Pt has experienced pain for three weeks.  Pt states that it is intermittent.  Pain usually  Is at night.

## 2014-02-28 MED ORDER — OMEPRAZOLE 20 MG PO CPDR: 20.0000 mg | DELAYED_RELEASE_CAPSULE | Freq: Every day | ORAL | Status: DC

## 2014-02-28 NOTE — ED Provider Notes (Signed)
CSN: [id number]     Arrival date & time 02/27/14  2207 History   First MD Initiated Contact with Patient 02/28/14 0043     Chief Complaint  Patient presents with  . Chest Pain    (Consider location/radiation/quality/duration/timing/severity/associated sxs/prior Treatment) HPI Comments: Patient is a 28 year old female with a history of palpitations who presents to the emergency department for further evaluation of chest pain. Patient states that she has been experiencing chest pain intermittently over the past 3 weeks. She states that pain lasts between a few seconds to up to 10 minutes. She denies any modifying factors of her symptoms and states that the pain is usually located in her upper chest, though she sometimes feels it just left of her lower sternal region. She describes the pain as a discomfort and states that it is nonradiating. She denies taking anything for her symptoms and states that she has followed up with a cardiologist to evaluate her chest pain with a negative workup. She has not yet followed up with her primary care doctor. She denies associated fever, hemoptysis, leg swelling, syncope or near-syncope, trauma or injury to the area, nausea, vomiting, or cough. No associated shortness of breath. No history of sudden cardiac death in her family. No recent surgeries or hospitalizations.  Patient is a 28 y.o. female presenting with chest pain. The history is provided by the patient. No language interpreter was used.  Chest Pain Associated symptoms: no cough, no fever, no shortness of breath and not vomiting     Past Medical History  Diagnosis Date  . Palpitations     negative echo   . Medical history non-contributory    Past Surgical History  Procedure Laterality Date  . No past surgeries     Family History  Problem Relation Age of Onset  . Hypertension Mother   . Hypertension Sister   . Diabetes Maternal Grandmother   . Cancer Maternal Grandmother     malignant breast  tumor  . Heart disease Neg Hx    History  Substance Use Topics  . Smoking status: Never Smoker   . Smokeless tobacco: Never Used  . Alcohol Use: No     Comment: occasional   OB History    Gravida Para Term Preterm AB TAB SAB Ectopic Multiple Living   1 1 1       1       Review of Systems  Constitutional: Negative for fever.  Respiratory: Negative for cough and shortness of breath.        Negative for hemoptysis  Cardiovascular: Positive for chest pain. Negative for leg swelling.  Gastrointestinal: Negative for vomiting.  Neurological: Negative for syncope and light-headedness.  All other systems reviewed and are negative.   Allergies  Review of patient's allergies indicates no known allergies.  Home Medications   Prior to Admission medications   Medication Sig Start Date End Date Taking? Authorizing Provider  ferrous fumarate (HEMOCYTE - 106 MG FE) 325 (106 FE) MG TABS tablet Take 1 tablet by mouth daily.    Yes Historical Provider, MD  ibuprofen (ADVIL,MOTRIN) 200 MG tablet Take 400 mg by mouth every 6 (six) hours as needed for moderate pain.   Yes Historical Provider, MD  Prenatal Vit-Fe Fumarate-FA (SE-NATAL 19) 29-1 MG CHEW Chew 1 tablet by mouth daily.  11/26/13  Yes Historical Provider, MD  omeprazole (PRILOSEC) 20 MG capsule Take 1 capsule (20 mg total) by mouth daily. 02/28/14   Antonietta Breach, PA-C   BP 124/77  mmHg  Pulse 57  Temp(Src) 98.1 F (36.7 C)  Resp 18  SpO2 98%  Breastfeeding? Yes   Physical Exam  Constitutional: She is oriented to person, place, and time. She appears well-developed and well-nourished. No distress.  Nontoxic/nonseptic appearing  HENT:  Head: Normocephalic and atraumatic.  Mouth/Throat: Oropharynx is clear and moist. No oropharyngeal exudate.  Eyes: Conjunctivae and EOM are normal. No scleral icterus.  Neck: Normal range of motion.  No JVD  Cardiovascular: Normal rate, regular rhythm and normal heart sounds.   Pulmonary/Chest:  Effort normal and breath sounds normal. No respiratory distress. She has no wheezes. She has no rales. She exhibits no tenderness.  Lungs clear bilaterally. Chest expansion symmetric  Musculoskeletal: Normal range of motion.  Neurological: She is alert and oriented to person, place, and time. She exhibits normal muscle tone. Coordination normal.  GCS 15. Patient moves extremities that ataxia.  Skin: Skin is warm and dry. No rash noted. She is not diaphoretic. No erythema. No pallor.  Psychiatric: She has a normal mood and affect. Her behavior is normal.  Nursing note and vitals reviewed.   ED Course  Procedures (including critical care time) Labs Review Labs Reviewed  CBC - Abnormal; Notable for the following:    Hemoglobin 11.9 (*)    All other components within normal limits  BASIC METABOLIC PANEL - Abnormal; Notable for the following:    Potassium 3.5 (*)    All other components within normal limits  I-STAT TROPOININ, ED  POC URINE PREG, ED    Imaging Review Dg Chest 2 View  02/27/2014   CLINICAL DATA:  Intermittent chest pain for 3 weeks.  EXAM: CHEST  2 VIEW  COMPARISON:  None.  FINDINGS: Trace pleural fluid on the right, best visualized in the lateral costophrenic sulcus. Normal heart size and mediastinal contours. No pneumonia, edema, or pneumothorax. Mild levo curvature of the thoracic spine.  IMPRESSION: Trace right pleural effusion.   Electronically Signed   By: Jorje Guild M.D.   On: 02/27/2014 23:54     EKG Interpretation None      MDM   Final diagnoses:  Chest pain, unspecified chest pain type    28 year old female presents to the emergency department for further evaluation of intermittent chest pain 3 weeks. Patient has seen a cardiologist for this complaint and told that her symptoms are not likely to be cardiac. Her workup today does not reveal findings concerning for ACS. She is noted to have H Rice right pleural effusion, though her pain is not  pleuritic in nature and is not located at the location where her pleural effusion is noted. Doubt dissection as well as pulmonary embolus. Patient is PERC negative. Symptoms may be musculoskeletal in nature or related to esophageal reflux. Will start the patient on for so Prilosec to evaluate for gastrointestinal cause. Primary care follow-up advised as outpatient and return precautions discussed. Patient agreeable to plan with no unaddressed concerns. Patient discharged in good condition; VSS.   Filed Vitals:   02/27/14 2221 02/28/14 0030  BP: 125/78 124/77  Pulse: 64 57  Temp: 98.1 F (36.7 C)   Resp: 20 18  SpO2: 100% 98%     Antonietta Breach, PA-C 02/28/14 [place], MD 02/28/14 [phone]

## 2014-02-28 NOTE — ED Notes (Signed)
Awake. Verbally responsive. A/O x4. Resp even and unlabored. No audible adventitious breath sounds noted. ABC's intact. NAD noted. 

## 2014-02-28 NOTE — Discharge Instructions (Signed)

## 2014-02-28 NOTE — ED Notes (Signed)
Awake. Verbally responsive. A/O x4. Resp even and unlabored. No audible adventitious breath sounds noted. ABC's intact.  

## 2014-03-04 ENCOUNTER — Ambulatory Visit: Payer: Medicaid Other | Admitting: Nurse Practitioner

## 2014-03-16 ENCOUNTER — Other Ambulatory Visit: Payer: Self-pay

## 2014-03-16 ENCOUNTER — Emergency Department (HOSPITAL_COMMUNITY)
Admission: EM | Admit: 2014-03-16 | Discharge: 2014-03-16 | Disposition: A | Discharge status: Home / Self Care | Payer: Medicaid Other | Attending: Emergency Medicine | Admitting: Emergency Medicine

## 2014-03-16 ENCOUNTER — Emergency Department (HOSPITAL_COMMUNITY): Payer: Medicaid Other

## 2014-03-16 ENCOUNTER — Encounter (HOSPITAL_COMMUNITY): Payer: Self-pay | Admitting: Emergency Medicine

## 2014-03-16 DIAGNOSIS — M549 Dorsalgia, unspecified: Secondary | ICD-10-CM | POA: Insufficient documentation

## 2014-03-16 DIAGNOSIS — R0789 Other chest pain: Secondary | ICD-10-CM | POA: Diagnosis not present

## 2014-03-16 DIAGNOSIS — Z79899 Other long term (current) drug therapy: Secondary | ICD-10-CM | POA: Insufficient documentation

## 2014-03-16 DIAGNOSIS — R079 Chest pain, unspecified: Secondary | ICD-10-CM | POA: Diagnosis present

## 2014-03-16 LAB — CBC
HEMATOCRIT: 34.1 % — AB (ref 36.0–46.0)
HEMOGLOBIN: 11 g/dL — AB (ref 12.0–15.0)
MCH: 29.6 pg (ref 26.0–34.0)
MCHC: 32.3 g/dL (ref 30.0–36.0)
MCV: 91.9 fL (ref 78.0–100.0)
Platelets: 316 10*3/uL (ref 150–400)
RBC: 3.71 MIL/uL — ABNORMAL LOW (ref 3.87–5.11)
RDW: 12.1 % (ref 11.5–15.5)
WBC: 5.9 10*3/uL (ref 4.0–10.5)

## 2014-03-16 LAB — BASIC METABOLIC PANEL
Anion gap: 5 (ref 5–15)
BUN: 9 mg/dL (ref 6–23)
CALCIUM: 9.1 mg/dL (ref 8.4–10.5)
CO2: 27 mmol/L (ref 19–32)
Chloride: 107 mEq/L (ref 96–112)
Creatinine, Ser: 0.73 mg/dL (ref 0.50–1.10)
GFR calc Af Amer: >90 mL/min (ref >90)
GLUCOSE: 98 mg/dL (ref 70–99)
POTASSIUM: 3.5 mmol/L (ref 3.5–5.1)
SODIUM: 139 mmol/L (ref 135–145)

## 2014-03-16 LAB — HEPATIC FUNCTION PANEL
ALK PHOS: 67 U/L (ref 39–117)
ALT: 10 U/L (ref 0–35)
AST: 22 U/L (ref 0–37)
Albumin: 3.8 g/dL (ref 3.5–5.2)
Total Bilirubin: 0.6 mg/dL (ref 0.3–1.2)
Total Protein: 7.3 g/dL (ref 6.0–8.3)

## 2014-03-16 LAB — I-STAT TROPONIN, ED: TROPONIN I, POC: 0 ng/mL (ref 0.00–0.08)

## 2014-03-16 LAB — D-DIMER, QUANTITATIVE (NOT AT ARMC): D DIMER QUANT: 0.27 ug{FEU}/mL (ref 0.00–0.48)

## 2014-03-16 LAB — LIPASE, BLOOD: LIPASE: 30 U/L (ref 11–59)

## 2014-03-16 NOTE — ED Notes (Signed)
Pt c/o sharp, stabbing, aching pain to lt chest x 3 wks.  Has not been seen by a doctor for it.  States that it is worse.  Radiating to back.  Pt in NAD.

## 2014-03-16 NOTE — ED Provider Notes (Signed)
CSN: [id number]     Arrival date & time 03/16/14  0845 History   First MD Initiated Contact with Patient 03/16/14 [phone]     Chief Complaint  Patient presents with  . Chest Pain     (Consider location/radiation/quality/duration/timing/severity/associated sxs/prior Treatment) HPI  28 year old female presents with intermittent chest pain for the past 2-3 weeks. She was seen here 3 weeks ago for similar symptoms. Occasionally has a burning sensation as well as upper back pain. No cough or shortness of breath. Chest pain is sometimes left-sided and sometimes right-sided. Also feels pain just under her xiphoid process. About 4 months ago she gave birth. She's been breast-feeding since. Denies any abdominal pain, nausea, or vomiting. No urinary symptoms. Pain is currently not present. She was prescribed Prilosec last time she was here but did not take it because she wasn't sure how would affect breast-feeding. Has tried tums.  Past Medical History  Diagnosis Date  . Palpitations     negative echo   . Medical history non-contributory    Past Surgical History  Procedure Laterality Date  . No past surgeries     Family History  Problem Relation Age of Onset  . Hypertension Mother   . Hypertension Sister   . Diabetes Maternal Grandmother   . Cancer Maternal Grandmother     malignant breast tumor  . Heart disease Neg Hx    History  Substance Use Topics  . Smoking status: Never Smoker   . Smokeless tobacco: Never Used  . Alcohol Use: No     Comment: occasional   OB History    Gravida Para Term Preterm AB TAB SAB Ectopic Multiple Living   1 1 1       1      Review of Systems  Constitutional: Negative for fever.  Respiratory: Negative for cough and shortness of breath.   Cardiovascular: Positive for chest pain. Negative for leg swelling.  Gastrointestinal: Negative for nausea, vomiting and abdominal pain.  Genitourinary: Negative for dysuria.  Musculoskeletal: Positive for back pain.   All other systems reviewed and are negative.     Allergies  Review of patient's allergies indicates no known allergies.  Home Medications   Prior to Admission medications   Medication Sig Start Date End Date Taking? Authorizing Provider  ferrous fumarate (HEMOCYTE - 106 MG FE) 325 (106 FE) MG TABS tablet Take 1 tablet by mouth daily.    Yes Historical Provider, MD  ibuprofen (ADVIL,MOTRIN) 200 MG tablet Take 400 mg by mouth every 6 (six) hours as needed for moderate pain.   Yes Historical Provider, MD  Prenatal Vit-Fe Fumarate-FA (SE-NATAL 19) 29-1 MG CHEW Chew 1 tablet by mouth daily.  11/26/13  Yes Historical Provider, MD  omeprazole (PRILOSEC) 20 MG capsule Take 1 capsule (20 mg total) by mouth daily. 02/28/14   Antonietta Breach, PA-C   BP 127/88 mmHg  Pulse 81  Temp(Src) 98.1 F (36.7 C) (Oral)  Resp 14  SpO2 100% Physical Exam  Constitutional: She is oriented to person, place, and time. She appears well-developed and well-nourished.  HENT:  Head: Normocephalic and atraumatic.  Right Ear: External ear normal.  Left Ear: External ear normal.  Nose: Nose normal.  Eyes: Right eye exhibits no discharge. Left eye exhibits no discharge.  Cardiovascular: Normal rate, regular rhythm and normal heart sounds.   Pulmonary/Chest: Effort normal and breath sounds normal. She exhibits no tenderness.  Abdominal: Soft. She exhibits no distension. There is no tenderness. There is negative Murphy's  sign.  Neurological: She is alert and oriented to person, place, and time.  Skin: Skin is warm and dry.  Nursing note and vitals reviewed.   ED Course  Procedures (including critical care time) Labs Review Labs Reviewed  CBC - Abnormal; Notable for the following:    RBC 3.71 (*)    Hemoglobin 11.0 (*)    HCT 34.1 (*)    All other components within normal limits  BASIC METABOLIC PANEL  D-DIMER, QUANTITATIVE  HEPATIC FUNCTION PANEL  LIPASE, BLOOD  I-STAT TROPOININ, ED    Imaging  Review Dg Chest 2 View  03/16/2014   CLINICAL DATA:  Upper and middle chest pain, on an off. History of Heart palpitations.  EXAM: CHEST  2 VIEW  COMPARISON:  02/27/2014  FINDINGS: The heart size and mediastinal contours are within normal limits. Both lungs are clear. The visualized skeletal structures are unremarkable.  IMPRESSION: No active cardiopulmonary disease.   Electronically Signed   By: Rolm Baptise M.D.   On: 03/16/2014 11:49     Date: 03/16/2014  Rate: 90  Rhythm: normal sinus rhythm  QRS Axis: normal  Intervals: normal  ST/T Wave abnormalities: inverted T wave V3 only  Conduction Disutrbances:none  Narrative Interpretation:   Old EKG Reviewed: changes noted    MDM   Final diagnoses:  Chest pain, unspecified chest pain type    Patient with atypical chest pain for last several weeks. Given she is recently postpartum, d-dimer sent to rule out low risk for PE. This is negative and I feel she's not need further PE workup at this time. With her burning sensation there is possibly an esophageal/GERD I recommended she consider Prilosec and she may need to stop breast-feeding while trying this. She will consider this and will follow-up with her PCP. I have very low suspicion for ACS. She does have 1 flipped T-wave in V3 only, this does not seem consistent with ischemia. Her symptoms also do not seem consistent with a cardiac cause.    Ephraim Hamburger, MD 03/16/14 [phone]

## 2014-03-16 NOTE — Discharge Instructions (Signed)

## 2014-03-16 NOTE — Progress Notes (Signed)
  CARE MANAGEMENT ED NOTE 03/16/2014  Patient:  Meghan Holland, Meghan Holland   Account Number:  [id number]  Date Initiated:  03/16/2014  Documentation initiated by:  Jackelyn Poling  Subjective/Objective Assessment:   71 yr medicaid [place] pt with 2 ED visits and 1 admission in last 6 months c/o sharp, stabbing, aching pain to lt chest x 3 wks Has not been seen by a doctor for it.  States that it is worse     Subjective/Objective Assessment Detail:   Pt confirmed with Cm her pcp is Dr Leslie Andrea but she is unable to get appointments with him on a timely basis  States his Appointments "are far out"  Pt refused offer of a list of [employer] providers x 2  Pt states she can find her own medicaid provider to assist her States she has been seeing her CV and is scheduled to "see her on the fifteenth" 04/01/14 (CV Truitt Merle, CV) Last seen per EPIC n 02/23/14 OV CM noted 0930 04/01/14 CV appt in EPIC  Pt states she is aware of how to get a name of a new medicaid provider off of medicaid.gov     Action/Plan:   CM noted pt with 2 ED visits, 1 admission in last 6 months Spoke with pt to assess need for community resources and education on navigation of community medical services offered list of medicaid providers to assist with change of medicaid   Action/Plan Detail:   pcp as needed Discussed differences & importance of pcp (gatekeeper), EDP & specialists care (only specific issues) management Discussed 2 ED visits/1 admission   Anticipated DC Date:  03/16/2014     Status Recommendation to Physician:   Result of Recommendation:    Other ED [place]  Other  Outpatient Services - Pt will follow up    Choice offered to / List presented to:            Status of service:  Completed, signed off  ED Comments:  Pt encouraged to Please contact the Dept of Social services case worker to have a local medicaid accepting primary  care doctor entered prior to going to one from list  ED Comments Detail:  Burtis Junes On 04/01/2014 Please go to your 0930 appointment with Dr Servando Snare on 04/01/14 but call her office if need to be evaluated prior to scheduled appointment [place]. [address] [place] [address] [phone]  [place] DSS   when or if you decide to change medicaid pcp using www.medicaid.gov please request change in medicaid pcp with DSS prior to seen new medicaid provider  [phone] (main) [address] [place], [place] [address] Medicaid Transportation: [phone] or [phone] Transportation Supervisor [phone]

## 2014-04-01 ENCOUNTER — Ambulatory Visit: Payer: Medicaid Other | Admitting: Nurse Practitioner

## 2014-04-25 ENCOUNTER — Encounter (HOSPITAL_COMMUNITY): Payer: Self-pay | Admitting: *Deleted

## 2014-04-25 ENCOUNTER — Emergency Department (HOSPITAL_COMMUNITY)
Admission: EM | Admit: 2014-04-25 | Discharge: 2014-04-25 | Disposition: A | Discharge status: Home / Self Care | Payer: Medicaid Other | Attending: Emergency Medicine | Admitting: Emergency Medicine

## 2014-04-25 DIAGNOSIS — K2100 Gastro-esophageal reflux disease with esophagitis, without bleeding: Secondary | ICD-10-CM

## 2014-04-25 DIAGNOSIS — Z79899 Other long term (current) drug therapy: Secondary | ICD-10-CM | POA: Insufficient documentation

## 2014-04-25 MED ORDER — RANITIDINE HCL 150 MG PO TABS: 150.0000 mg | ORAL_TABLET | Freq: Two times a day (BID) | ORAL | Status: DC

## 2014-04-25 MED ORDER — SUCRALFATE 1 G PO TABS: 1.0000 g | ORAL_TABLET | Freq: Three times a day (TID) | ORAL | Status: DC

## 2014-04-25 NOTE — ED Provider Notes (Signed)
CSN: [id number]     Arrival date & time 04/25/14  0150 History   First MD Initiated Contact with Patient 04/25/14 0244     Chief Complaint  Patient presents with  . Chest Pain     (Consider location/radiation/quality/duration/timing/severity/associated sxs/prior Treatment) Patient is a 29 y.o. female presenting with chest pain. The history is provided by the patient.  Chest Pain Pain location:  Epigastric and L chest Pain quality: aching and burning   Pain radiates to:  Does not radiate Pain radiates to the back: no   Pain severity:  Moderate Onset quality:  Gradual Duration:  4 months Timing:  Intermittent Progression:  Worsening Chronicity:  Recurrent Context comment:  Started during pregnancy and has continued since delivery of her child intermittently Relieved by:  None tried (Patient has been seen multiple times for this and she was prescribed Prilosec however she never took it because they could not say if it was safe for breast-feeding moms) Exacerbated by: Lying down and sometimes eating. Ineffective treatments: Intermittent antacid use. Associated symptoms: shortness of breath   Associated symptoms: no anorexia, no cough, no fever, no lower extremity edema, no nausea and not vomiting   Associated symptoms comment:  Occasional shortness of breath with the pain. No shortness of breath with exertion Risk factors: no birth control, no coronary artery disease, no hypertension, no immobilization, not pregnant, no smoking and no surgery     Past Medical History  Diagnosis Date  . Palpitations     negative echo   . Medical history non-contributory    Past Surgical History  Procedure Laterality Date  . No past surgeries     Family History  Problem Relation Age of Onset  . Hypertension Mother   . Hypertension Sister   . Diabetes Maternal Grandmother   . Cancer Maternal Grandmother     malignant breast tumor  . Heart disease Neg Hx    History  Substance Use Topics  .  Smoking status: Never Smoker   . Smokeless tobacco: Never Used  . Alcohol Use: No     Comment: occasional   OB History    Gravida Para Term Preterm AB TAB SAB Ectopic Multiple Living   1 1 1       1      Review of Systems  Constitutional: Negative for fever.  Respiratory: Positive for shortness of breath. Negative for cough.   Cardiovascular: Positive for chest pain.  Gastrointestinal: Negative for nausea, vomiting and anorexia.  All other systems reviewed and are negative.     Allergies  Review of patient's allergies indicates no known allergies.  Home Medications   Prior to Admission medications   Medication Sig Start Date End Date Taking? Authorizing Provider  ferrous fumarate (HEMOCYTE - 106 MG FE) 325 (106 FE) MG TABS tablet Take 1 tablet by mouth daily.     Historical Provider, MD  ibuprofen (ADVIL,MOTRIN) 200 MG tablet Take 400 mg by mouth every 6 (six) hours as needed for moderate pain.    Historical Provider, MD  omeprazole (PRILOSEC) 20 MG capsule Take 1 capsule (20 mg total) by mouth daily. 02/28/14   Antonietta Breach, PA-C  Prenatal Vit-Fe Fumarate-FA (SE-NATAL 19) 29-1 MG CHEW Chew 1 tablet by mouth daily.  11/26/13   Historical Provider, MD   BP 127/73 mmHg  Pulse 84  Temp(Src) 98.3 F (36.8 C) (Oral)  Resp 18  Ht 5\' 8"  (1.727 m)  Wt 174 lb (78.926 kg)  BMI 26.46 kg/m2  SpO2  100% Physical Exam  Constitutional: She is oriented to person, place, and time. She appears well-developed and well-nourished. No distress.  HENT:  Head: Normocephalic and atraumatic.  Mouth/Throat: Oropharynx is clear and moist.  Eyes: Conjunctivae and EOM are normal. Pupils are equal, round, and reactive to light.  Neck: Normal range of motion. Neck supple.  Cardiovascular: Normal rate, regular rhythm and intact distal pulses.   No murmur heard. Pulmonary/Chest: Effort normal and breath sounds normal. No respiratory distress. She has no wheezes. She has no rales.  Abdominal: Soft. She  exhibits no distension. There is tenderness in the epigastric area. There is no rebound and no guarding.  Musculoskeletal: Normal range of motion. She exhibits no edema or tenderness.  Neurological: She is alert and oriented to person, place, and time.  Skin: Skin is warm and dry. No rash noted. No erythema.  Psychiatric: She has a normal mood and affect. Her behavior is normal.  Nursing note and vitals reviewed.   ED Course  Procedures (including critical care time) Labs Review Labs Reviewed - No data to display  Imaging Review No results found.   EKG Interpretation   Date/Time:  Monday April 25 2014 02:00:09 EST Ventricular Rate:  76 PR Interval:  138 QRS Duration: 90 QT Interval:  358 QTC Calculation: 402 R Axis:   88 Text Interpretation:  Sinus rhythm Atrial premature complex Baseline  wander in lead(s) II Confirmed by Maryan Rued  MD, Loree Fee ([id number]) on  04/25/2014 2:36:36 AM      MDM   Final diagnoses:  Gastroesophageal reflux disease with esophagitis    Patient presenting with intermittent epigastric and left-sided chest pain that's been ongoing now for several months. It's been a little worse in the last few days. It seems to be slightly worse with lying down and sometimes with eating. She states occasionally she'll get shortness of breath when she gets the pain. She seen a cardiologist who has cleared her from a cardiac standpoint. She now has a 42-month-old infant tenderness still breast-feeding. She is not sleeping long length of time yet and takes ibuprofen intermittently. In the past when she was seen she's had complete workups including d-dimer, CBC, CMP, UA, chest x-ray, EKG the most recent approximately 5-6 weeks ago. Nothing is really changed per the patient since that time. She is taking no medications at that time. She never took the Prilosec because nobody could tell her if it was safe for her child while breast-feeding so she'll occasionally take Tums.  Her  EKG today is unchanged and at this time with a normal hemoglobin, LFTs, d-dimer, chest x-ray all done within the last 5 weeks do not feel that patient needs a repeat evaluation. She is sitting in the bed she is comfortable and in no acute distress. Discussed with patient a GI cocktail which she refused. Will start patient on Zantac and Carafate which I assured her were both safe for breast-feeding moms. She will follow-up with her doctor.    Blanchie Dessert, MD 04/25/14 [phone]

## 2014-04-25 NOTE — ED Notes (Signed)
Pt reports intermittent, sharp, left-sided chest pain x2 months, states "a little worse tonight" - pt w/ intermittent shortness of breath as well.

## 2014-04-25 NOTE — Discharge Instructions (Signed)
Esophagitis Esophagitis is inflammation of the esophagus. It can involve swelling, soreness, and pain in the esophagus. This condition can make it difficult and painful to swallow. CAUSES  Most causes of esophagitis are not serious. Many different factors can cause esophagitis, including:  Gastroesophageal reflux disease (GERD). This is when acid from your stomach flows up into the esophagus.  Recurrent vomiting.  An allergic-type reaction.  Certain medicines, especially those that come in large pills.  Ingestion of harmful chemicals, such as household cleaning products.  Heavy alcohol use.  An infection of the esophagus.  Radiation treatment for cancer.  Certain diseases such as sarcoidosis, Crohn's disease, and scleroderma. These diseases may cause recurrent esophagitis. SYMPTOMS   Trouble swallowing.  Painful swallowing.  Chest pain.  Difficulty breathing.  Nausea.  Vomiting.  Abdominal pain. DIAGNOSIS  Your caregiver will take your history and do a physical exam. Depending upon what your caregiver finds, certain tests may also be done, including:  Barium X-ray. You will drink a solution that coats the esophagus, and X-rays will be taken.  Endoscopy. A lighted tube is put down the esophagus so your caregiver can examine the area.  Allergy tests. These can sometimes be arranged through follow-up visits. TREATMENT  Treatment will depend on the cause of your esophagitis. In some cases, steroids or other medicines may be given to help relieve your symptoms or to treat the underlying cause of your condition. Medicines that may be recommended include:  Viscous lidocaine, to soothe the esophagus.  Antacids.  Acid reducers.  Proton pump inhibitors.  Antiviral medicines for certain viral infections of the esophagus.  Antifungal medicines for certain fungal infections of the esophagus.  Antibiotic medicines, depending on the cause of the esophagitis. HOME CARE  INSTRUCTIONS   Avoid foods and drinks that seem to make your symptoms worse.  Eat small, frequent meals instead of large meals.  Avoid eating for the 3 hours prior to your bedtime.  If you have trouble taking pills, use a pill splitter to decrease the size and likelihood of the pill getting stuck or injuring the esophagus on the way down. Drinking water after taking a pill also helps.  Stop smoking if you smoke.  Maintain a healthy weight.  Wear loose-fitting clothing. Do not wear anything tight around your waist that causes pressure on your stomach.  Raise the head of your bed 6 to 8 inches with wood blocks to help you sleep. Extra pillows will not help.  Only take over-the-counter or prescription medicines as directed by your caregiver. SEEK IMMEDIATE MEDICAL CARE IF:  You have severe chest pain that radiates into your arm, neck, or jaw.  You feel sweaty, dizzy, or lightheaded.  You have shortness of breath.  You vomit blood.  You have difficulty or pain with swallowing.  You have bloody or black, tarry stools.  You have a fever.  You have a burning sensation in the chest more than 3 times a week for more than 2 weeks.  You cannot swallow, drink, or eat.  You drool because you cannot swallow your saliva. MAKE SURE YOU:  Understand these instructions.  Will watch your condition.  Will get help right away if you are not doing well or get worse. Document Released: 04/11/2004 Document Revised: 05/27/2011 Document Reviewed: 11/02/2010 [facility] Patient Information 2015 [employer], [place]. This information is not intended to replace advice given to you by your health care provider. Make sure you discuss any questions you have with your health care provider.  Food Choices for Gastroesophageal Reflux Disease When you have gastroesophageal reflux disease (GERD), the foods you eat and your eating habits are very important. Choosing the right foods can help ease the discomfort  of GERD. WHAT GENERAL GUIDELINES DO I NEED TO FOLLOW?  Choose fruits, vegetables, whole grains, low-fat dairy products, and low-fat meat, fish, and poultry.  Limit fats such as oils, salad dressings, butter, nuts, and avocado.  Keep a food diary to identify foods that cause symptoms.  Avoid foods that cause reflux. These may be different for different people.  Eat frequent small meals instead of three large meals each day.  Eat your meals slowly, in a relaxed setting.  Limit fried foods.  Cook foods using methods other than frying.  Avoid drinking alcohol.  Avoid drinking large amounts of liquids with your meals.  Avoid bending over or lying down until 2-3 hours after eating. WHAT FOODS ARE NOT RECOMMENDED? The following are some foods and drinks that may worsen your symptoms: Vegetables Tomatoes. Tomato juice. Tomato and spaghetti sauce. Chili peppers. Onion and garlic. Horseradish. Fruits Oranges, grapefruit, and lemon (fruit and juice). Meats High-fat meats, fish, and poultry. This includes hot dogs, ribs, ham, sausage, salami, and bacon. Dairy Whole milk and chocolate milk. Sour cream. Cream. Butter. Ice cream. Cream cheese.  Beverages Coffee and tea, with or without caffeine. Carbonated beverages or energy drinks. Condiments Hot sauce. Barbecue sauce.  Sweets/Desserts Chocolate and cocoa. Donuts. Peppermint and spearmint. Fats and Oils High-fat foods, including [place] fries and potato chips. Other Vinegar. Strong spices, such as black pepper, white pepper, red pepper, cayenne, curry powder, cloves, ginger, and chili powder. The items listed above may not be a complete list of foods and beverages to avoid. Contact your dietitian for more information. Document Released: 03/04/2005 Document Revised: 03/09/2013 Document Reviewed: 01/06/2013 [facility] Patient Information 2015 [place], [place]. This information is not intended to replace advice given to you by your health  care provider. Make sure you discuss any questions you have with your health care provider.

## 2014-05-06 ENCOUNTER — Encounter: Payer: Self-pay | Admitting: Nurse Practitioner

## 2014-05-06 ENCOUNTER — Ambulatory Visit (INDEPENDENT_AMBULATORY_CARE_PROVIDER_SITE_OTHER): Payer: Medicaid Other | Admitting: Nurse Practitioner

## 2014-05-06 ENCOUNTER — Telehealth: Payer: Self-pay | Admitting: *Deleted

## 2014-05-06 VITALS — BP 116/82 | HR 58 | Ht 68.0 in | Wt 173.0 lb

## 2014-05-06 DIAGNOSIS — R079 Chest pain, unspecified: Secondary | ICD-10-CM

## 2014-05-06 LAB — LIPID PANEL
Cholesterol: 109 mg/dL (ref 0–200)
HDL: 58.5 mg/dL (ref >39.00)
LDL Cholesterol: 43 mg/dL (ref 0–99)
NonHDL: 50.5
Total CHOL/HDL Ratio: 2
Triglycerides: 39 mg/dL (ref 0.0–149.0)
VLDL: 7.8 mg/dL (ref 0.0–40.0)

## 2014-05-06 NOTE — Telephone Encounter (Signed)
-----   Message from Burtis Junes, NP sent at 05/06/2014  2:55 PM EST ----- Ok to report. Her lipids are great.

## 2014-05-06 NOTE — Patient Instructions (Addendum)
We will be checking the following labs today Lipids  Stay on your current medicines - including the Zantac and Carafate  We will arrange for a treadmill test (GXT)  Call the [place] office at [phone] if you have any questions, problems or concerns.

## 2014-05-06 NOTE — Progress Notes (Signed)
CARDIOLOGY OFFICE NOTE  Date:  05/06/2014    [name] Date of Birth: 11/17/1985 Medical Record #[id number]  PCP:  Robert Bellow, MD  Cardiologist:  Althia Forts  Chief Complaint  Patient presents with  . Chest Pain    Work in visit - former patient of Dr. Susa Simmonds..      History of Present Illness: Meghan Holland is a 29 y.o. female who presents today for a follow up visit. She was formerly seen by Dr. Doreatha Lew. She has had a past history of palpitations. She had a normal echo in October of 2011. Non smoker. No early CAD with her family.   I saw her back in December of 2015 with atypical chest pain. Not felt to be cardiac. She has had multiple ER visits. No show rate over 25%.   Last ER visit 10 days ago - she had had an extensive work up 5 weeks (labs, d dimer, CXR and EKG) prior - refused GI cocktail. Was advised to use Zantack and Carafate. Never took Prilosec because of uncertainty if it interfered with her breastfeeding.   She comes in today. Here alone. Says she continues to have this intermittent chest pain. Says it is hard to describe. Always with rest. Feels sharp. Lasts just a few minutes. Nothing she can do to bring symptoms on or make go away. She is taking the Zantac and Carafate - does not feel like it is helping. No regular exercise. She does not smoke. Denies drug use. No early CAD in her family. Has some rare palpitation but nothing that is bothersome for her.   Past Medical History  Diagnosis Date  . Palpitations     negative echo   . Medical history non-contributory     Past Surgical History  Procedure Laterality Date  . No past surgeries       Medications: Current Outpatient Prescriptions  Medication Sig Dispense Refill  . ferrous fumarate (HEMOCYTE - 106 MG FE) 325 (106 FE) MG TABS tablet Take 1 tablet by mouth daily.     Marland Kitchen ibuprofen (ADVIL,MOTRIN) 200 MG tablet Take 400 mg by mouth every 6 (six) hours as needed for moderate pain.      . Prenatal Vit-Fe Fumarate-FA (SE-NATAL 19) 29-1 MG CHEW Chew 1 tablet by mouth daily.   12  . ranitidine (ZANTAC) 150 MG tablet Take 1 tablet (150 mg total) by mouth 2 (two) times daily. 28 tablet 0  . sucralfate (CARAFATE) 1 G tablet Take 1 tablet (1 g total) by mouth 4 (four) times daily -  with meals and at bedtime. 30 tablet 0   No current facility-administered medications for this visit.    Allergies: No Known Allergies  Social History: The patient  reports that she has never smoked. She has never used smokeless tobacco. She reports that she does not drink alcohol or use illicit drugs.   Family History: The patient's family history includes Cancer in her maternal grandmother; Diabetes in her maternal grandmother; Hypertension in her mother and sister. There is no history of Heart disease.   Review of Systems: Please see the history of present illness.   Otherwise, the review of systems is positive for chest pain.   All other systems are reviewed and negative.   Physical Exam: VS:  BP 116/82 mmHg  Pulse 58  Ht 5\' 8"  (1.727 m)  Wt 173 lb (78.472 kg)  BMI 26.31 kg/m2 .  BMI Body mass index is 26.31 kg/(m^2).  Wt Readings from Last 3 Encounters:  05/06/14 173 lb (78.472 kg)  04/25/14 174 lb (78.926 kg)  02/23/14 178 lb 12.8 oz (81.103 kg)    General: Pleasant. Well developed, well nourished and in no acute distress.  HEENT: Normal. Neck: Supple, no JVD, carotid bruits, or masses noted.  Cardiac: Regular rate and rhythm. No murmurs, rubs, or gallops. No edema.  Respiratory:  Lungs are clear to auscultation bilaterally with normal work of breathing.  GI: Soft and nontender.  MS: No deformity or atrophy. Gait and ROM intact. Skin: Warm and dry. Color is normal.  Neuro:  Strength and sensation are intact and no gross focal deficits noted.  Psych: Alert, appropriate and with normal affect.   LABORATORY DATA:  EKG:  EKG is not ordered today. EKG from ER visits reviewed.    Lab Results  Component Value Date   WBC 5.9 03/16/2014   HGB 11.0* 03/16/2014   HCT 34.1* 03/16/2014   PLT 316 03/16/2014   GLUCOSE 98 03/16/2014   ALT 10 03/16/2014   AST 22 03/16/2014   NA 139 03/16/2014   K 3.5 03/16/2014   CL 107 03/16/2014   CREATININE 0.73 03/16/2014   BUN 9 03/16/2014   CO2 27 03/16/2014   TSH 1.02 03/26/2011    BNP (last 3 results) No results for input(s): BNP in the last 8760 hours.  ProBNP (last 3 results) No results for input(s): PROBNP in the last 8760 hours.   Other Studies Reviewed Today:   Echo Study Conclusions from 12/2009 Left ventricle: Systolic function was normal. The estimated ejection fraction was in the range of 60% to 65%   CHEST 2 VIEW  COMPARISON: 02/27/2014  FINDINGS: The heart size and mediastinal contours are within normal limits. Both lungs are clear. The visualized skeletal structures are unremarkable.  IMPRESSION: No active cardiopulmonary disease.   Electronically Signed  By: Rolm Baptise M.D.  On: 03/16/2014 11:49   Assessment/Plan: 1. Atypical chest pain - I do not feel this is cardiac - she does not have any CV risk factors. Will arrange for GXT for assurance. Check lipids today as well. If this all looks good, she needs to see her PCP. Would continue with her Zantac and Carafate for now.   2. Palpitations - does not seem to be an issue.   Current medicines are reviewed with the patient today.  The patient does not have concerns regarding medicines other than what has been noted above.  The following changes have been made:  See above.  Labs/ tests ordered today include:    Orders Placed This Encounter  Procedures  . Lipid panel  . Exercise Tolerance Test     Disposition:   Will arrange for GXT. Further disposition to follow.  Patient is agreeable to this plan and will call if any problems develop in the interim.   Signed: Burtis Junes, RN, ANP-C 05/06/2014 9:04  AM  [facility] [address], [place]  [address] Phone: [phone] Fax: [phone]

## 2014-05-09 NOTE — Telephone Encounter (Signed)
Follow Up  Pt returned call for la results please call/sr

## 2014-05-09 NOTE — Telephone Encounter (Signed)
The patient is aware of her results.  

## 2014-06-06 ENCOUNTER — Ambulatory Visit (INDEPENDENT_AMBULATORY_CARE_PROVIDER_SITE_OTHER): Payer: Self-pay | Admitting: Physician Assistant

## 2014-06-06 DIAGNOSIS — R079 Chest pain, unspecified: Secondary | ICD-10-CM

## 2014-06-06 NOTE — Progress Notes (Signed)
Exercise Treadmill Test  Pre-Exercise Testing Evaluation Rhythm: normal sinus  Rate: 65     Test  Exercise Tolerance Test Ordering MD: Daneen Schick, MD  Interpreting MD: Richardson Dopp, PA-C  Unique Test No: 1  Treadmill:  1  Indication for ETT: chest pain - rule out ischemia  Contraindication to ETT: No   Stress Modality: exercise - treadmill  Cardiac Imaging Performed: non   Protocol: standard Bruce - maximal  Max BP:  173/77  Max MPHR (bpm):  192 85% MPR (bpm):  163  MPHR obtained (bpm):  179 % MPHR obtained:  93  Reached 85% MPHR (min:sec):  6:12 Total Exercise Time (min-sec):  8:00  Workload in METS:  10.1 Borg Scale: 15  Reason ETT Terminated:  desired heart rate attained    ST Segment Analysis At Rest: normal ST segments - no evidence of significant ST depression With Exercise: no evidence of significant ST depression  Other Information Arrhythmia:  No Angina during ETT:  absent (0) Quality of ETT:  diagnostic  ETT Interpretation:  normal - no evidence of ischemia by ST analysis  Comments: Good exercise capacity. No chest pain. Normal BP response to exercise. No ST changes to suggest ischemia.   Recommendations: FU with Truitt Merle, NP as planned. Signed, Richardson Dopp, PA-C   06/06/2014 9:43 AM

## 2014-07-04 ENCOUNTER — Emergency Department (HOSPITAL_COMMUNITY)
Admission: EM | Admit: 2014-07-04 | Discharge: 2014-07-04 | Disposition: A | Discharge status: Home / Self Care | Payer: Self-pay | Attending: Emergency Medicine | Admitting: Emergency Medicine

## 2014-07-04 ENCOUNTER — Encounter (HOSPITAL_COMMUNITY): Payer: Self-pay | Admitting: Emergency Medicine

## 2014-07-04 DIAGNOSIS — X58XXXA Exposure to other specified factors, initial encounter: Secondary | ICD-10-CM | POA: Insufficient documentation

## 2014-07-04 DIAGNOSIS — S139XXA Sprain of joints and ligaments of unspecified parts of neck, initial encounter: Secondary | ICD-10-CM | POA: Insufficient documentation

## 2014-07-04 DIAGNOSIS — Y9389 Activity, other specified: Secondary | ICD-10-CM | POA: Insufficient documentation

## 2014-07-04 DIAGNOSIS — M62830 Muscle spasm of back: Secondary | ICD-10-CM | POA: Insufficient documentation

## 2014-07-04 DIAGNOSIS — Y9289 Other specified places as the place of occurrence of the external cause: Secondary | ICD-10-CM | POA: Insufficient documentation

## 2014-07-04 DIAGNOSIS — Y998 Other external cause status: Secondary | ICD-10-CM | POA: Insufficient documentation

## 2014-07-04 DIAGNOSIS — Z79899 Other long term (current) drug therapy: Secondary | ICD-10-CM | POA: Insufficient documentation

## 2014-07-04 NOTE — ED Notes (Signed)
Pt c/o intermittent neck stiffness on the L side and intermittent L thoracic back pain x 3 days. Pt denies known injury. Pt denies any weakness, numbness, or tingling. Pt ambulatory with steady gait.

## 2014-07-04 NOTE — ED Notes (Signed)
MD at bedside. 

## 2014-07-04 NOTE — ED Provider Notes (Signed)
CSN: [id number]     Arrival date & time 07/04/14  0208 History   First MD Initiated Contact with Patient 07/04/14 0226     Chief Complaint  Patient presents with  . neck stiffness   . Back Pain     (Consider location/radiation/quality/duration/timing/severity/associated sxs/prior Treatment) Patient is a 29 y.o. female presenting with back pain.  Back Pain Location:  Generalized Quality:  Cramping and stiffness Stiffness is present:  All day Radiates to:  Does not radiate Pain severity:  Moderate Pain is:  Same all the time Onset quality:  Gradual Duration:  3 days Timing:  Constant Progression:  Unchanged Chronicity:  New Context: not falling, not recent illness and not recent injury   Relieved by:  Nothing Worsened by:  Movement, standing, touching and twisting Associated symptoms: tingling (intermittently)   Associated symptoms: no abdominal pain, no bladder incontinence, no bowel incontinence, no dysuria, no fever, no paresthesias and no perianal numbness     Past Medical History  Diagnosis Date  . Palpitations     negative echo   . Medical history non-contributory    Past Surgical History  Procedure Laterality Date  . No past surgeries     Family History  Problem Relation Age of Onset  . Hypertension Mother   . Hypertension Sister   . Diabetes Maternal Grandmother   . Cancer Maternal Grandmother     malignant breast tumor  . Heart disease Neg Hx    History  Substance Use Topics  . Smoking status: Never Smoker   . Smokeless tobacco: Never Used  . Alcohol Use: No     Comment: occasional   OB History    Gravida Para Term Preterm AB TAB SAB Ectopic Multiple Living   1 1 1       1      Review of Systems  Constitutional: Negative for fever.  Gastrointestinal: Negative for abdominal pain and bowel incontinence.  Genitourinary: Negative for bladder incontinence and dysuria.  Musculoskeletal: Positive for back pain.  Neurological: Positive for tingling  (intermittently). Negative for paresthesias.  All other systems reviewed and are negative.     Allergies  Review of patient's allergies indicates no known allergies.  Home Medications   Prior to Admission medications   Medication Sig Start Date End Date Taking? Authorizing Provider  ferrous fumarate (HEMOCYTE - 106 MG FE) 325 (106 FE) MG TABS tablet Take 1 tablet by mouth daily.     Historical Provider, MD  ibuprofen (ADVIL,MOTRIN) 200 MG tablet Take 400 mg by mouth every 6 (six) hours as needed for moderate pain.    Historical Provider, MD  Prenatal Vit-Fe Fumarate-FA (SE-NATAL 19) 29-1 MG CHEW Chew 1 tablet by mouth daily.  11/26/13   Historical Provider, MD  ranitidine (ZANTAC) 150 MG tablet Take 1 tablet (150 mg total) by mouth 2 (two) times daily. 04/25/14   Blanchie Dessert, MD  sucralfate (CARAFATE) 1 G tablet Take 1 tablet (1 g total) by mouth 4 (four) times daily -  with meals and at bedtime. 04/25/14   Blanchie Dessert, MD   BP 136/67 mmHg  Pulse 80  Temp(Src) 97.7 F (36.5 C) (Oral)  Resp 20  SpO2 100%  Breastfeeding? Yes Physical Exam  Constitutional: She is oriented to person, place, and time. She appears well-developed and well-nourished.  HENT:  Head: Normocephalic and atraumatic.  Right Ear: External ear normal.  Left Ear: External ear normal.  Eyes: Conjunctivae and EOM are normal. Pupils are equal, round, and reactive  to light.  Neck: Normal range of motion. Neck supple.  Cardiovascular: Normal rate, regular rhythm, normal heart sounds and intact distal pulses.   Pulmonary/Chest: Effort normal and breath sounds normal.  Abdominal: Soft. Bowel sounds are normal. There is no tenderness.  Musculoskeletal: Normal range of motion.       Cervical back: She exhibits tenderness. She exhibits normal range of motion and no bony tenderness.       Thoracic back: She exhibits tenderness. She exhibits normal range of motion and no bony tenderness.       Lumbar back: She  exhibits tenderness and spasm. She exhibits normal range of motion and no bony tenderness.  Neurological: She is alert and oriented to person, place, and time.  Skin: Skin is warm and dry.  Vitals reviewed.   ED Course  Procedures (including critical care time) Labs Review Labs Reviewed - No data to display  Imaging Review No results found.   EKG Interpretation None      MDM   Final diagnoses:  Neck sprain and strain, initial encounter  Lumbar paraspinal muscle spasm    29 y.o. female without pertinent PMH presents with atraumatic back pain as above.  Exam consistent with paraspinal spasm and strain.  No signs of cord pathology, no fevers, and no systemic symptoms.  Discussed symptomatic therapy.  Standard return precautions given.    I have reviewed all laboratory and imaging studies if ordered as above  1. Neck sprain and strain, initial encounter   2. Lumbar paraspinal muscle spasm         Debby Freiberg, MD 07/04/14 [phone]

## 2014-07-04 NOTE — Discharge Instructions (Signed)
You can take Ibuprofen 400-600mg  in limited amounts while breastfeeding.  You should talk to your primary care doctor about the risks of taking further medications, as there is relatively little data available for these medications and lactation. Radicular Pain Radicular pain in either the arm or leg is usually from a bulging or herniated disk in the spine. A piece of the herniated disk may press against the nerves as the nerves exit the spine. This causes pain which is felt at the tips of the nerves down the arm or leg. Other causes of radicular pain may include:  Fractures.  Heart disease.  Cancer.  An abnormal and usually degenerative state of the nervous system or nerves (neuropathy). Diagnosis may require CT or MRI scanning to determine the primary cause.  Nerves that start at the neck (nerve roots) may cause radicular pain in the outer shoulder and arm. It can spread down to the thumb and fingers. The symptoms vary depending on which nerve root has been affected. In most cases radicular pain improves with conservative treatment. Neck problems may require physical therapy, a neck collar, or cervical traction. Treatment may take many weeks, and surgery may be considered if the symptoms do not improve.  Conservative treatment is also recommended for sciatica. Sciatica causes pain to radiate from the lower back or buttock area down the leg into the foot. Often there is a history of back problems. Most patients with sciatica are better after 2 to 4 weeks of rest and other supportive care. Short term bed rest can reduce the disk pressure considerably. Sitting, however, is not a good position since this increases the pressure on the disk. You should avoid bending, lifting, and all other activities which make the problem worse. Traction can be used in severe cases. Surgery is usually reserved for patients who do not improve within the first months of treatment. Only take over-the-counter or prescription  medicines for pain, discomfort, or fever as directed by your caregiver. Narcotics and muscle relaxants may help by relieving more severe pain and spasm and by providing mild sedation. Cold or massage can give significant relief. Spinal manipulation is not recommended. It can increase the degree of disc protrusion. Epidural steroid injections are often effective treatment for radicular pain. These injections deliver medicine to the spinal nerve in the space between the protective covering of the spinal cord and back bones (vertebrae). Your caregiver can give you more information about steroid injections. These injections are most effective when given within two weeks of the onset of pain.  You should see your caregiver for follow up care as recommended. A program for neck and back injury rehabilitation with stretching and strengthening exercises is an important part of management.  SEEK IMMEDIATE MEDICAL CARE IF:  You develop increased pain, weakness, or numbness in your arm or leg.  You develop difficulty with bladder or bowel control.  You develop abdominal pain. Document Released: 04/11/2004 Document Revised: 05/27/2011 Document Reviewed: 06/27/2008 [facility] Patient Information 2015 [place], [place]. This information is not intended to replace advice given to you by your health care provider. Make sure you discuss any questions you have with your health care provider.

## 2014-08-02 ENCOUNTER — Emergency Department (HOSPITAL_COMMUNITY): Payer: Self-pay

## 2014-08-02 ENCOUNTER — Encounter (HOSPITAL_COMMUNITY): Payer: Self-pay | Admitting: Emergency Medicine

## 2014-08-02 ENCOUNTER — Emergency Department (HOSPITAL_COMMUNITY)
Admission: EM | Admit: 2014-08-02 | Discharge: 2014-08-02 | Disposition: A | Discharge status: Home / Self Care | Payer: Self-pay | Attending: Emergency Medicine | Admitting: Emergency Medicine

## 2014-08-02 DIAGNOSIS — M940 Chondrocostal junction syndrome [Tietze]: Secondary | ICD-10-CM | POA: Insufficient documentation

## 2014-08-02 DIAGNOSIS — Z79899 Other long term (current) drug therapy: Secondary | ICD-10-CM | POA: Insufficient documentation

## 2014-08-02 LAB — BASIC METABOLIC PANEL
ANION GAP: 8 (ref 5–15)
BUN: 12 mg/dL (ref 6–20)
CHLORIDE: 106 mmol/L (ref 101–111)
CO2: 23 mmol/L (ref 22–32)
Calcium: 8.8 mg/dL — ABNORMAL LOW (ref 8.9–10.3)
Creatinine, Ser: 0.58 mg/dL (ref 0.44–1.00)
GFR calc Af Amer: >60 mL/min (ref >60)
GFR calc non Af Amer: >60 mL/min (ref >60)
Glucose, Bld: 98 mg/dL (ref 65–99)
Potassium: 3.4 mmol/L — ABNORMAL LOW (ref 3.5–5.1)
Sodium: 137 mmol/L (ref 135–145)

## 2014-08-02 LAB — CBC
HEMATOCRIT: 32.8 % — AB (ref 36.0–46.0)
HEMOGLOBIN: 10.5 g/dL — AB (ref 12.0–15.0)
MCH: 29.3 pg (ref 26.0–34.0)
MCHC: 32 g/dL (ref 30.0–36.0)
MCV: 91.6 fL (ref 78.0–100.0)
Platelets: 275 10*3/uL (ref 150–400)
RBC: 3.58 MIL/uL — ABNORMAL LOW (ref 3.87–5.11)
RDW: 12.2 % (ref 11.5–15.5)
WBC: 6.3 10*3/uL (ref 4.0–10.5)

## 2014-08-02 LAB — I-STAT TROPONIN, ED: Troponin i, poc: 0 ng/mL (ref 0.00–0.08)

## 2014-08-02 MED ORDER — NAPROXEN 500 MG PO TABS
500.0000 mg | ORAL_TABLET | Freq: Once | ORAL | Status: DC
  Filled 2014-08-02: qty 1

## 2014-08-02 MED ORDER — NAPROXEN 500 MG PO TABS: 500.0000 mg | ORAL_TABLET | Freq: Two times a day (BID) | ORAL | Status: DC

## 2014-08-02 NOTE — ED Notes (Signed)
Patient c/o chest pain, states she was "just sitting" when pain started. Patient states pain occurs when she leans forward and with inspiration. Patient indicates pain at epigastric and under her left breast. Patient denies taking any medications for the pain. Patient denies medical hx. Patient A&Ox4, NAD noted, texting while in triage.

## 2014-08-02 NOTE — Discharge Instructions (Signed)
Costochondritis Costochondritis, sometimes called Tietze syndrome, is a swelling and irritation (inflammation) of the tissue (cartilage) that connects your ribs with your breastbone (sternum). It causes pain in the chest and rib area. Costochondritis usually goes away on its own over time. It can take up to 6 weeks or longer to get better, especially if you are unable to limit your activities. CAUSES  Some cases of costochondritis have no known cause. Possible causes include:  Injury (trauma).  Exercise or activity such as lifting.  Severe coughing. SIGNS AND SYMPTOMS  Pain and tenderness in the chest and rib area.  Pain that gets worse when coughing or taking deep breaths.  Pain that gets worse with specific movements. DIAGNOSIS  Your health care provider will do a physical exam and ask about your symptoms. Chest X-rays or other tests may be done to rule out other problems. TREATMENT  Costochondritis usually goes away on its own over time. Your health care provider may prescribe medicine to help relieve pain. HOME CARE INSTRUCTIONS   Avoid exhausting physical activity. Try not to strain your ribs during normal activity. This would include any activities using chest, abdominal, and side muscles, especially if heavy weights are used.  Apply ice to the affected area for the first 2 days after the pain begins.  Put ice in a plastic bag.  Place a towel between your skin and the bag.  Leave the ice on for 20 minutes, 2-3 times a day.  Only take over-the-counter or prescription medicines as directed by your health care provider. SEEK MEDICAL CARE IF:  You have redness or swelling at the rib joints. These are signs of infection.  Your pain does not go away despite rest or medicine. SEEK IMMEDIATE MEDICAL CARE IF:   Your pain increases or you are very uncomfortable.  You have shortness of breath or difficulty breathing.  You cough up blood.  You have worse chest pains,  sweating, or vomiting.  You have a fever or persistent symptoms for more than 2-3 days.  You have a fever and your symptoms suddenly get worse. MAKE SURE YOU:   Understand these instructions.  Will watch your condition.  Will get help right away if you are not doing well or get worse. Document Released: 12/12/2004 Document Revised: 12/23/2012 Document Reviewed: 10/06/2012 [employer] Patient Information 2015 [employer]. This information is not intended to replace advice given to you by your health care provider. Make sure you discuss any questions you have with your health care provider.  

## 2014-08-02 NOTE — ED Provider Notes (Signed)
CSN: [id number]     Arrival date & time 08/02/14  0307 History   First MD Initiated Contact with Patient 08/02/14 0454     Chief Complaint  Patient presents with  . Chest Pain    with inspiration, epigastric and left lower breast, also hurts when she leans forward     (Consider location/radiation/quality/duration/timing/severity/associated sxs/prior Treatment) HPI 29 year old female presents to emergency department with complaint of pain to left central chest and under left breast.  Patient reports pain started around midnight.  Pain is worse with rotation and deep breathing.  He takes no medications.  She denies any past medical history.  She denies any fever, chills, nausea, vomiting or diarrhea.  She has no cough.  She has no shortness of breath.  She has no lower leg swelling.  She is not on birth control.  She is nonsmoker.  No medication taken prior to arrival. Past Medical History  Diagnosis Date  . Palpitations     negative echo   . Medical history non-contributory    Past Surgical History  Procedure Laterality Date  . No past surgeries     Family History  Problem Relation Age of Onset  . Hypertension Mother   . Hypertension Sister   . Diabetes Maternal Grandmother   . Cancer Maternal Grandmother     malignant breast tumor  . Heart disease Neg Hx    History  Substance Use Topics  . Smoking status: Never Smoker   . Smokeless tobacco: Never Used  . Alcohol Use: No     Comment: occasional   OB History    Gravida Para Term Preterm AB TAB SAB Ectopic Multiple Living   1 1 1       1      Review of Systems    See History of Present Illness; otherwise all other systems are reviewed and negative Allergies  Review of patient's allergies indicates no known allergies.  Home Medications   Prior to Admission medications   Medication Sig Start Date End Date Taking? Authorizing Provider  ferrous fumarate (HEMOCYTE - 106 MG FE) 325 (106 FE) MG TABS tablet Take 1 tablet by  mouth daily.     Historical Provider, MD  ibuprofen (ADVIL,MOTRIN) 200 MG tablet Take 400 mg by mouth every 6 (six) hours as needed for moderate pain.    Historical Provider, MD  naproxen (NAPROSYN) 500 MG tablet Take 1 tablet (500 mg total) by mouth 2 (two) times daily with a meal. 08/02/14   Linton Flemings, MD  Prenatal Vit-Fe Fumarate-FA (SE-NATAL 19) 29-1 MG CHEW Chew 1 tablet by mouth daily.  11/26/13   Historical Provider, MD  ranitidine (ZANTAC) 150 MG tablet Take 1 tablet (150 mg total) by mouth 2 (two) times daily. 04/25/14   Blanchie Dessert, MD  sucralfate (CARAFATE) 1 G tablet Take 1 tablet (1 g total) by mouth 4 (four) times daily -  with meals and at bedtime. 04/25/14   Blanchie Dessert, MD   BP 125/77 mmHg  Pulse 82  Temp(Src) 97.7 F (36.5 C) (Oral)  Resp 17  Ht 5\' 9"  (1.753 m)  Wt 168 lb (76.204 kg)  BMI 24.80 kg/m2  SpO2 100% Physical Exam  Constitutional: She is oriented to person, place, and time. She appears well-developed and well-nourished.  HENT:  Head: Normocephalic and atraumatic.  Nose: Nose normal.  Mouth/Throat: Oropharynx is clear and moist.  Eyes: Conjunctivae and EOM are normal. Pupils are equal, round, and reactive to light.  Neck: Normal  range of motion. Neck supple. No JVD present. No tracheal deviation present. No thyromegaly present.  Cardiovascular: Normal rate, regular rhythm, normal heart sounds and intact distal pulses.  Exam reveals no gallop and no friction rub.   No murmur heard. Pulmonary/Chest: Effort normal and breath sounds normal. No stridor. No respiratory distress. She has no wheezes. She has no rales. She exhibits tenderness (patient has tenderness along left sternal margin which reproduces the pain).  Abdominal: Soft. Bowel sounds are normal. She exhibits no distension and no mass. There is no tenderness. There is no rebound and no guarding.  Musculoskeletal: Normal range of motion. She exhibits no edema or tenderness.  Lymphadenopathy:    She  has no cervical adenopathy.  Neurological: She is alert and oriented to person, place, and time. She displays normal reflexes. She exhibits normal muscle tone. Coordination normal.  Skin: Skin is warm and dry. No rash noted. No erythema. No pallor.  Psychiatric: She has a normal mood and affect. Her behavior is normal. Judgment and thought content normal.  Nursing note and vitals reviewed.   ED Course  Procedures (including critical care time) Labs Review Labs Reviewed  CBC - Abnormal; Notable for the following:    RBC 3.58 (*)    Hemoglobin 10.5 (*)    HCT 32.8 (*)    All other components within normal limits  BASIC METABOLIC PANEL - Abnormal; Notable for the following:    Potassium 3.4 (*)    Calcium 8.8 (*)    All other components within normal limits  I-STAT TROPOININ, ED  POC URINE PREG, ED    Imaging Review Dg Chest 2 View  08/02/2014   CLINICAL DATA:  Acute onset LEFT chest pain, worse with inspiration.  EXAM: CHEST  2 VIEW  COMPARISON:  Chest radiograph March 16, 2014  FINDINGS: Cardiomediastinal silhouette is unremarkable. The lungs are clear without pleural effusions or focal consolidations. Trachea projects midline and there is no pneumothorax. Soft tissue planes and included osseous structures are non-suspicious.  IMPRESSION: Normal chest.   Electronically Signed   By: Elon Alas   On: 08/02/2014 03:57     EKG Interpretation   Date/Time:  Tuesday Aug 02 2014 03:13:20 EDT Ventricular Rate:  75 PR Interval:  187 QRS Duration: 99 QT Interval:  358 QTC Calculation: 400 R Axis:   95 Text Interpretation:  Sinus rhythm Borderline right axis deviation  Confirmed by Juna Caban  MD, Terina Mcelhinny ([id number]) on 08/02/2014 4:20:46 AM      MDM   Final diagnoses:  Costochondritis    29 year old female with left sided chest pain that appears to be musculoskeletal in nature.  Most likely costochondritis.  Her workup was otherwise unremarkable.  She is stable for discharge home  with NSAIDs.    Linton Flemings, MD 08/02/14 [phone]

## 2014-08-19 ENCOUNTER — Emergency Department (HOSPITAL_COMMUNITY)
Admission: EM | Admit: 2014-08-19 | Discharge: 2014-08-19 | Disposition: A | Discharge status: Home / Self Care | Payer: Self-pay | Attending: Emergency Medicine | Admitting: Emergency Medicine

## 2014-08-19 ENCOUNTER — Encounter (HOSPITAL_COMMUNITY): Payer: Self-pay | Admitting: Emergency Medicine

## 2014-08-19 DIAGNOSIS — M62830 Muscle spasm of back: Secondary | ICD-10-CM | POA: Insufficient documentation

## 2014-08-19 DIAGNOSIS — Z79899 Other long term (current) drug therapy: Secondary | ICD-10-CM | POA: Insufficient documentation

## 2014-08-19 DIAGNOSIS — G629 Polyneuropathy, unspecified: Secondary | ICD-10-CM | POA: Insufficient documentation

## 2014-08-19 DIAGNOSIS — Z791 Long term (current) use of non-steroidal anti-inflammatories (NSAID): Secondary | ICD-10-CM | POA: Insufficient documentation

## 2014-08-19 MED ORDER — CYCLOBENZAPRINE HCL 10 MG PO TABS
5.0000 mg | ORAL_TABLET | Freq: Once | ORAL | Status: AC
  Administered 2014-08-19: 5 mg via ORAL
  Filled 2014-08-19: qty 1

## 2014-08-19 MED ORDER — ACETAMINOPHEN 325 MG PO TABS
650.0000 mg | ORAL_TABLET | Freq: Once | ORAL | Status: AC
  Administered 2014-08-19: 650 mg via ORAL
  Filled 2014-08-19: qty 2

## 2014-08-19 MED ORDER — ACETAMINOPHEN 500 MG PO TABS: 500.0000 mg | ORAL_TABLET | Freq: Four times a day (QID) | ORAL | Status: DC | PRN

## 2014-08-19 MED ORDER — CYCLOBENZAPRINE HCL 5 MG PO TABS: 5.0000 mg | ORAL_TABLET | Freq: Two times a day (BID) | ORAL | Status: DC | PRN

## 2014-08-19 NOTE — ED Provider Notes (Signed)
CSN: [id number]     Arrival date & time 08/19/14  0110 History   First MD Initiated Contact with Patient 08/19/14 0131     Chief Complaint  Patient presents with  . Neck Pain  . Back Pain     (Consider location/radiation/quality/duration/timing/severity/associated sxs/prior Treatment) HPI   PCP: No PCP Per Patient Blood pressure 126/73, pulse 90, temperature 98.1 F (36.7 C), temperature source Oral, resp. rate 20, last menstrual period 07/31/2014, SpO2 100 %, currently breastfeeding.  Meghan Holland is a 29 y.o.female with a significant PMH of presents to the ER with complaints of low, mid and upper back pain for the past few days as well as paresthesias and hot cold sensations into her extremities intermittently without any weakness. She has a 4 month old baby that she holds often. She denies any trauma, denies headache, fevers, CP, SOB, cough/cold symptoms or generalized weakness. Denies skin color change.   Past Medical History  Diagnosis Date  . Palpitations     negative echo   . Medical history non-contributory    Past Surgical History  Procedure Laterality Date  . No past surgeries     Family History  Problem Relation Age of Onset  . Hypertension Mother   . Hypertension Sister   . Diabetes Maternal Grandmother   . Cancer Maternal Grandmother     malignant breast tumor  . Heart disease Neg Hx    History  Substance Use Topics  . Smoking status: Never Smoker   . Smokeless tobacco: Never Used  . Alcohol Use: No     Comment: occasional   OB History    Gravida Para Term Preterm AB TAB SAB Ectopic Multiple Living   1 1 1       1      Review of Systems 10 Systems reviewed and are negative for acute change except as noted in the HPI.      Allergies  Review of patient's allergies indicates no known allergies.  Home Medications   Prior to Admission medications   Medication Sig Start Date End Date Taking? Authorizing Provider  ibuprofen (ADVIL,MOTRIN) 200 MG  tablet Take 400 mg by mouth every 6 (six) hours as needed for moderate pain.   Yes Historical Provider, MD  acetaminophen (TYLENOL) 500 MG tablet Take 1 tablet (500 mg total) by mouth every 6 (six) hours as needed. 08/19/14   Shamaine Mulkern Carlota Raspberry, PA-C  cyclobenzaprine (FLEXERIL) 5 MG tablet Take 1 tablet (5 mg total) by mouth 2 (two) times daily as needed for muscle spasms. 08/19/14   Delos Haring, PA-C  ferrous fumarate (HEMOCYTE - 106 MG FE) 325 (106 FE) MG TABS tablet Take 1 tablet by mouth daily.     Historical Provider, MD  naproxen (NAPROSYN) 500 MG tablet Take 1 tablet (500 mg total) by mouth 2 (two) times daily with a meal. 08/02/14   Linton Flemings, MD  Prenatal Vit-Fe Fumarate-FA (SE-NATAL 19) 29-1 MG CHEW Chew 1 tablet by mouth daily.  11/26/13   Historical Provider, MD  ranitidine (ZANTAC) 150 MG tablet Take 1 tablet (150 mg total) by mouth 2 (two) times daily. 04/25/14   Blanchie Dessert, MD  sucralfate (CARAFATE) 1 G tablet Take 1 tablet (1 g total) by mouth 4 (four) times daily -  with meals and at bedtime. 04/25/14   Blanchie Dessert, MD   BP 126/73 mmHg  Pulse 90  Temp(Src) 98.1 F (36.7 C) (Oral)  Resp 20  SpO2 100%  LMP 07/31/2014 (Approximate) Physical Exam  Constitutional: She appears well-developed and well-nourished. No distress.  HENT:  Head: Normocephalic and atraumatic.  Eyes: Pupils are equal, round, and reactive to light.  Neck: Normal range of motion. Neck supple.  Cardiovascular: Normal rate and regular rhythm.   Pulmonary/Chest: Effort normal.  Abdominal: Soft.  Musculoskeletal:       Cervical back: Normal.       Thoracic back: Normal.       Lumbar back: Normal.  Mild tenderness to palpation of patients entire paraspinal column. No rash noted. No crepitus or induration. FROM without discomfort. Normal ambulation. Strength to all 4 extremities is 4+/4+.   No clonus on dorsiflextion Skin color is normal. Skin is warm and moist.  I see no step off deformity, no midline  bony tenderness.  Pt is able to ambulate.  No crepitus, laceration, effusion, induration, lesions, swelling.   Pedal pulses are symmetrical and palpable bilaterally     Neurological: She is alert.  Skin: Skin is warm and dry.  Nursing note and vitals reviewed.   ED Course  Procedures (including critical care time) Labs Review Labs Reviewed - No data to display  Imaging Review No results found.   EKG Interpretation None      MDM   Final diagnoses:  Back muscle spasm  Neuropathy    She is currently breast feeding and therefore medications are limited, she is describing nerve pain due to MSK inflammation. Will Rx tylenol and flexeril- it is currently unknown if flexeril is excreted into breast milk, pt informed of this. Pt advised to rest, stretch, soak in hot bath with epsom salt. Also given referral to Ortho doc.  Presentation is non concerning for [facility] Nov[facility], ICH, Meningitis, or temporal arteritis. Pt is afebrile with no focal neuro deficits, nuchal rigidity, or change in vision. The patient denies any symptoms of neurological impairment or TIA's; no amaurosis, diplopia, dysphasia, or unilateral disturbance of motor or sensory function. No loss of balance or vertigo.   Medications  acetaminophen (TYLENOL) tablet 650 mg (not administered)  cyclobenzaprine (FLEXERIL) tablet 5 mg (not administered)    29 y.o.Meghan Holland's evaluation in the Emergency Department is complete. It has been determined that no acute conditions requiring further emergency intervention are present at this time. The patient/guardian have been advised of the diagnosis and plan. We have discussed signs and symptoms that warrant return to the ED, such as changes or worsening in symptoms.  Vital signs are stable at discharge. Filed Vitals:   08/19/14 0114  BP: 126/73  Pulse: 90  Temp: 98.1 F (36.7 C)  Resp: 20    Patient/guardian has voiced understanding and agreed to follow-up with the PCP or  specialist.     Delos Haring, PA-C 08/19/14 [id number]  Merryl Hacker, MD 08/19/14 [phone]

## 2014-08-19 NOTE — ED Notes (Signed)
Pt states she is having back pain and neck pain  Pt states she is having pain in her legs, numbness in her fingers and toes, and her feet feel cold

## 2014-08-19 NOTE — Discharge Instructions (Signed)
IT  IS NOT KNOWN IF FLEXERIL THE MUSCLE RELAXER IS EXCRETED INTO BREAST MILK.    Musculoskeletal Pain Musculoskeletal pain is muscle and boney aches and pains. These pains can occur in any part of the body. Your caregiver may treat you without knowing the cause of the pain. They may treat you if blood or urine tests, X-rays, and other tests were normal.  CAUSES There is often not a definite cause or reason for these pains. These pains may be caused by a type of germ (virus). The discomfort may also come from overuse. Overuse includes working out too hard when your body is not fit. Boney aches also come from weather changes. Bone is sensitive to atmospheric pressure changes. HOME CARE INSTRUCTIONS   Ask when your test results will be ready. Make sure you get your test results.  Only take over-the-counter or prescription medicines for pain, discomfort, or fever as directed by your caregiver. If you were given medications for your condition, do not drive, operate machinery or power tools, or sign legal documents for 24 hours. Do not drink alcohol. Do not take sleeping pills or other medications that may interfere with treatment.  Continue all activities unless the activities cause more pain. When the pain lessens, slowly resume normal activities. Gradually increase the intensity and duration of the activities or exercise.  During periods of severe pain, bed rest may be helpful. Lay or sit in any position that is comfortable.  Putting ice on the injured area.  Put ice in a bag.  Place a towel between your skin and the bag.  Leave the ice on for 15 to 20 minutes, 3 to 4 times a day.  Follow up with your caregiver for continued problems and no reason can be found for the pain. If the pain becomes worse or does not go away, it may be necessary to repeat tests or do additional testing. Your caregiver may need to look further for a possible cause. SEEK IMMEDIATE MEDICAL CARE IF:  You have pain  that is getting worse and is not relieved by medications.  You develop chest pain that is associated with shortness or breath, sweating, feeling sick to your stomach (nauseous), or throw up (vomit).  Your pain becomes localized to the abdomen.  You develop any new symptoms that seem different or that concern you. MAKE SURE YOU:   Understand these instructions.  Will watch your condition.  Will get help right away if you are not doing well or get worse. Document Released: 03/04/2005 Document Revised: 05/27/2011 Document Reviewed: 11/06/2012 [facility] Patient Information 2015 [place], [place]. This information is not intended to replace advice given to you by your health care provider. Make sure you discuss any questions you have with your health care provider. Paresthesia Paresthesia is an abnormal burning or prickling sensation. This sensation is generally felt in the hands, arms, legs, or feet. However, it may occur in any part of the body. It is usually not painful. The feeling may be described as:  Tingling or numbness.  "Pins and needles."  Skin crawling.  Buzzing.  Limbs "falling asleep."  Itching. Most people experience temporary (transient) paresthesia at some time in their lives. CAUSES  Paresthesia may occur when you breathe too quickly (hyperventilation). It can also occur without any apparent cause. Commonly, paresthesia occurs when pressure is placed on a nerve. The feeling quickly goes away once the pressure is removed. For some people, however, paresthesia is a long-lasting (chronic) condition caused by an  underlying disorder. The underlying disorder may be:  A traumatic, direct injury to nerves. Examples include a:  Broken (fractured) neck.  Fractured skull.  A disorder affecting the brain and spinal cord (central nervous system). Examples include:  Transverse myelitis.  Encephalitis.  Transient ischemic attack.  Multiple sclerosis.  Stroke.  Tumor or  blood vessel problems, such as an arteriovenous malformation pressing against the brain or spinal cord.  A condition that damages the peripheral nerves (peripheral neuropathy). Peripheral nerves are not part of the brain and spinal cord. These conditions include:  Diabetes.  Peripheral vascular disease.  Nerve entrapment syndromes, such as carpal tunnel syndrome.  Shingles.  Hypothyroidism.  Vitamin B12 deficiencies.  Alcoholism.  Heavy metal poisoning (lead, arsenic).  Rheumatoid arthritis.  Systemic lupus erythematosus. DIAGNOSIS  Your caregiver will attempt to find the underlying cause of your paresthesia. Your caregiver may:  Take your medical history.  Perform a physical exam.  Order various lab tests.  Order imaging tests. TREATMENT  Treatment for paresthesia depends on the underlying cause. HOME CARE INSTRUCTIONS  Avoid drinking alcohol.  You may consider massage or acupuncture to help relieve your symptoms.  Keep all follow-up appointments as directed by your caregiver. SEEK IMMEDIATE MEDICAL CARE IF:   You feel weak.  You have trouble walking or moving.  You have problems with speech or vision.  You feel confused.  You cannot control your bladder or bowel movements.  You feel numbness after an injury.  You faint.  Your burning or prickling feeling gets worse when walking.  You have pain, cramps, or dizziness.  You develop a rash. MAKE SURE YOU:  Understand these instructions.  Will watch your condition.  Will get help right away if you are not doing well or get worse. Document Released: 02/22/2002 Document Revised: 05/27/2011 Document Reviewed: 11/23/2010 [facility] Patient Information 2015 [place], [place]. This information is not intended to replace advice given to you by your health care provider. Make sure you discuss any questions you have with your health care provider.

## 2015-01-20 IMAGING — CR DG CHEST 2V
2 series · 2 of 2 positions shown · non-contrast
Comparison: None.

CLINICAL DATA: Intermittent chest pain for 3 weeks.

EXAM:
CHEST  2 VIEW

[w chest pa]
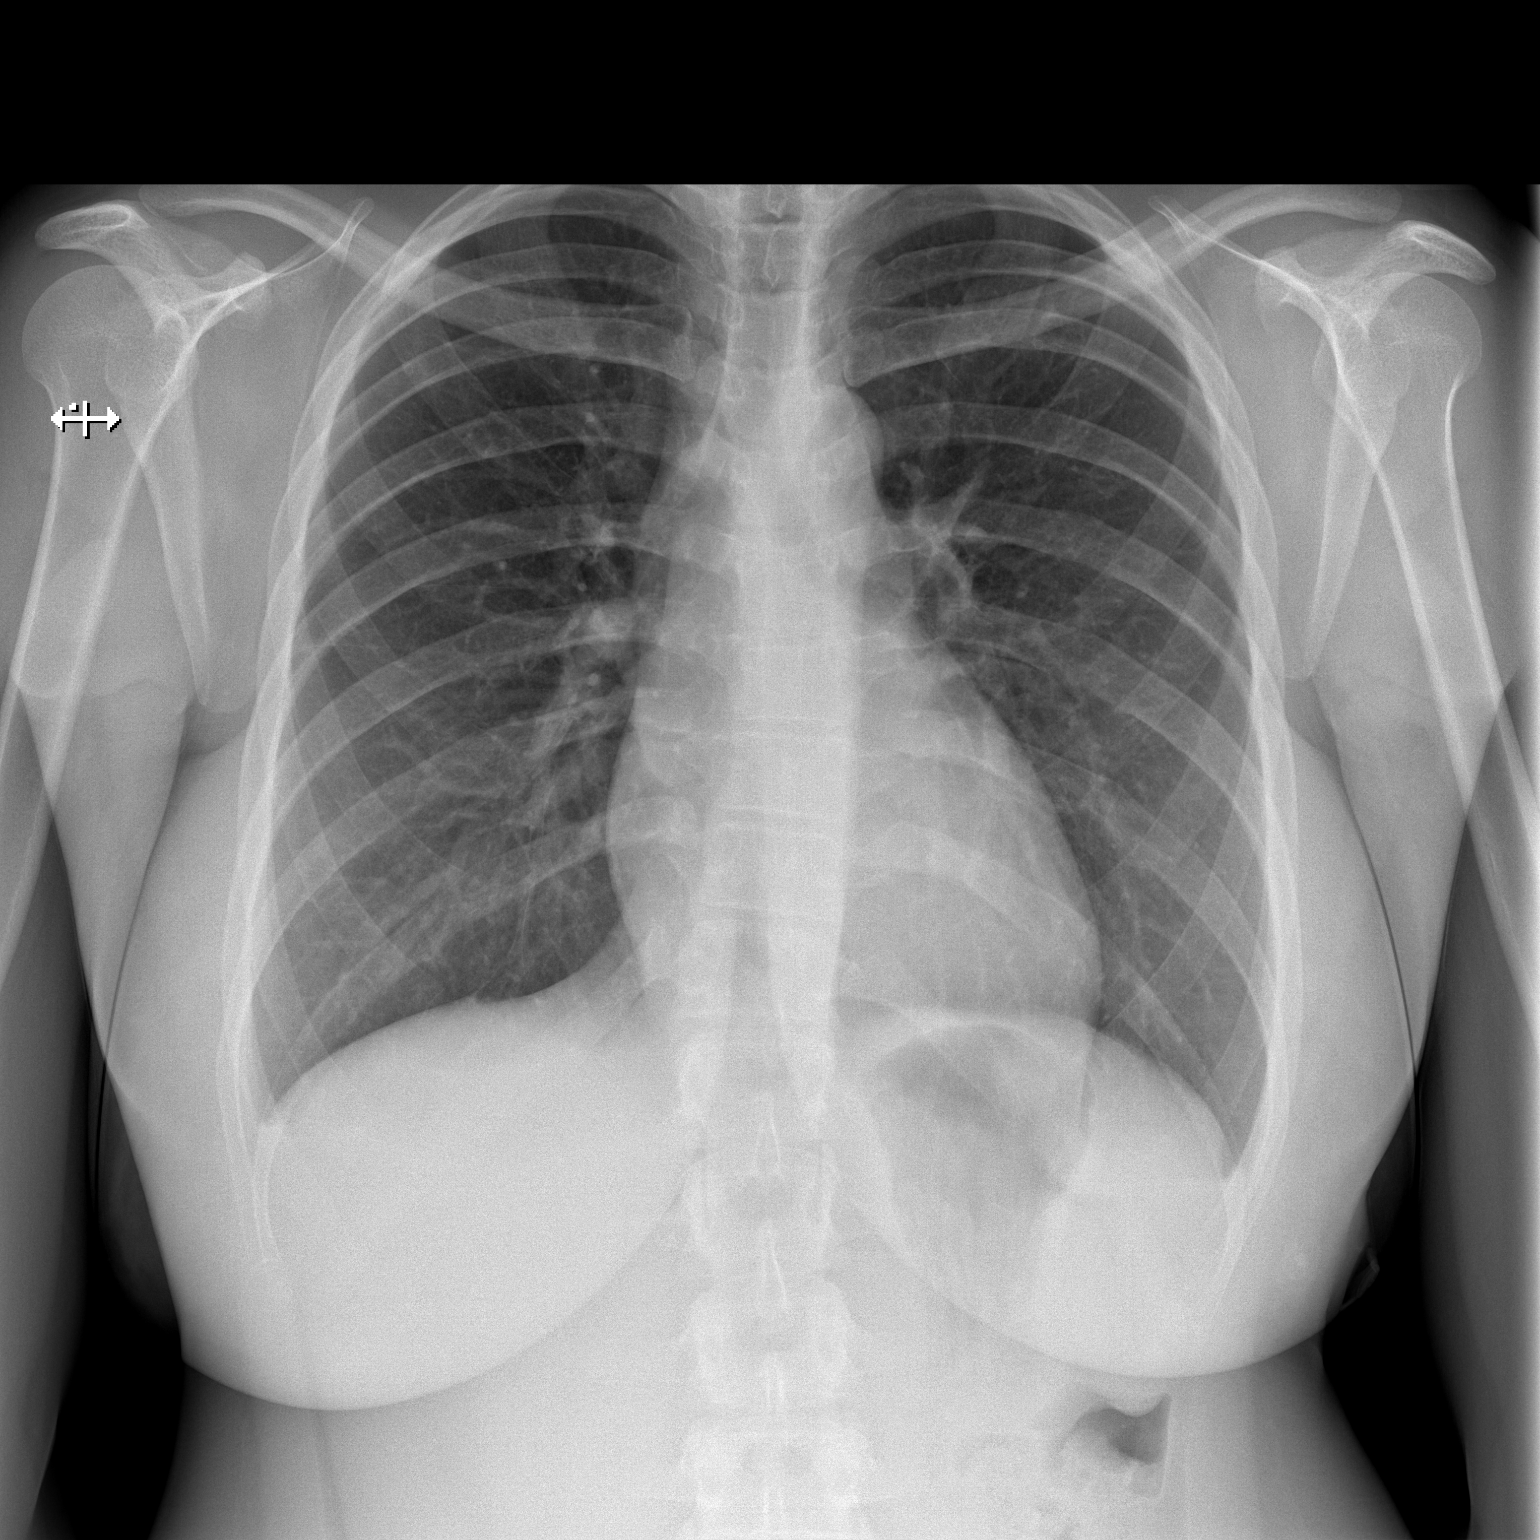

[w chest lat]
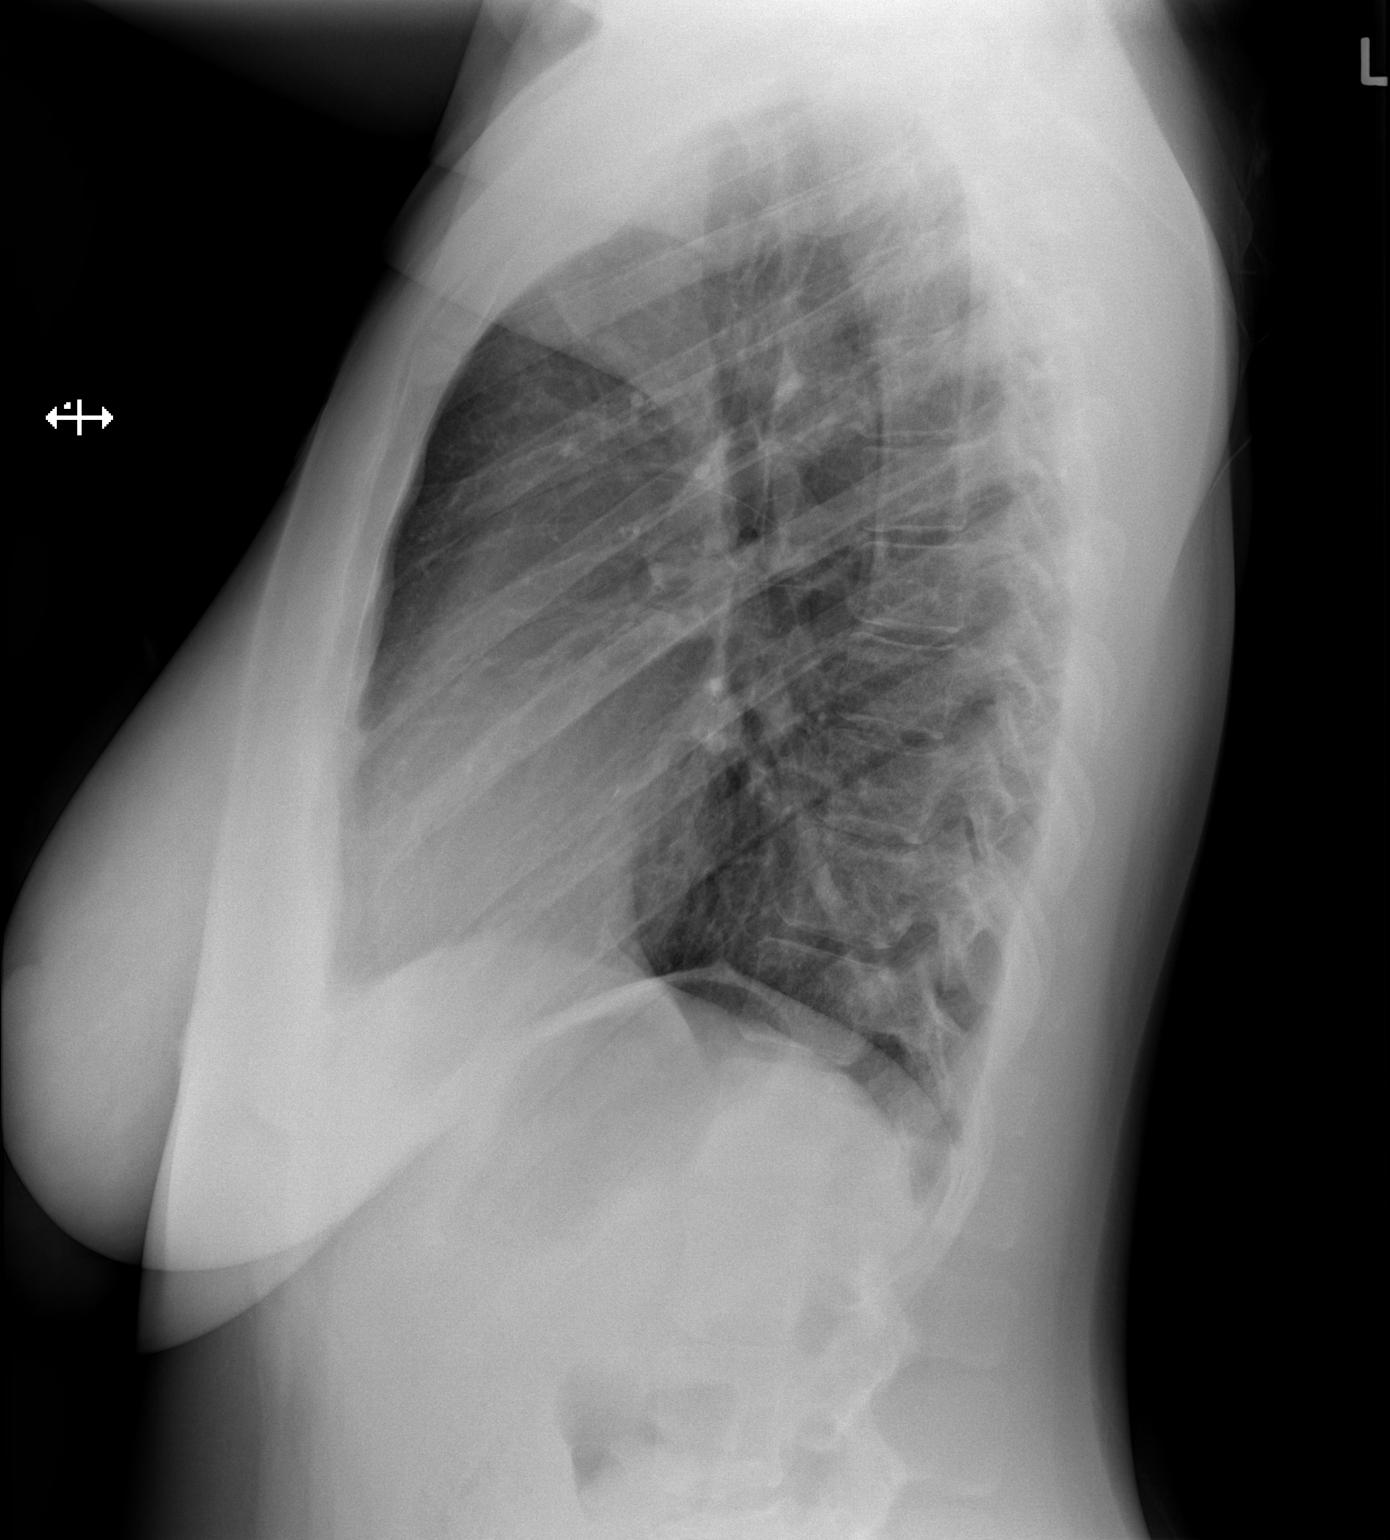

[2 of 2 positions shown; findings below may reference images not displayed]

FINDINGS: Trace pleural fluid on the right, best visualized in the lateral
costophrenic sulcus. Normal heart size and mediastinal contours. No
pneumonia, edema, or pneumothorax. Mild levo curvature of the
thoracic spine.
IMPRESSION: Trace right pleural effusion.

## 2015-06-25 IMAGING — CR DG CHEST 2V
2 series · 2 of 2 positions shown · non-contrast
Comparison: Chest radiograph March 16, 2014

CLINICAL DATA: Acute onset LEFT chest pain, worse with inspiration.

EXAM:
CHEST  2 VIEW

[w chest pa]
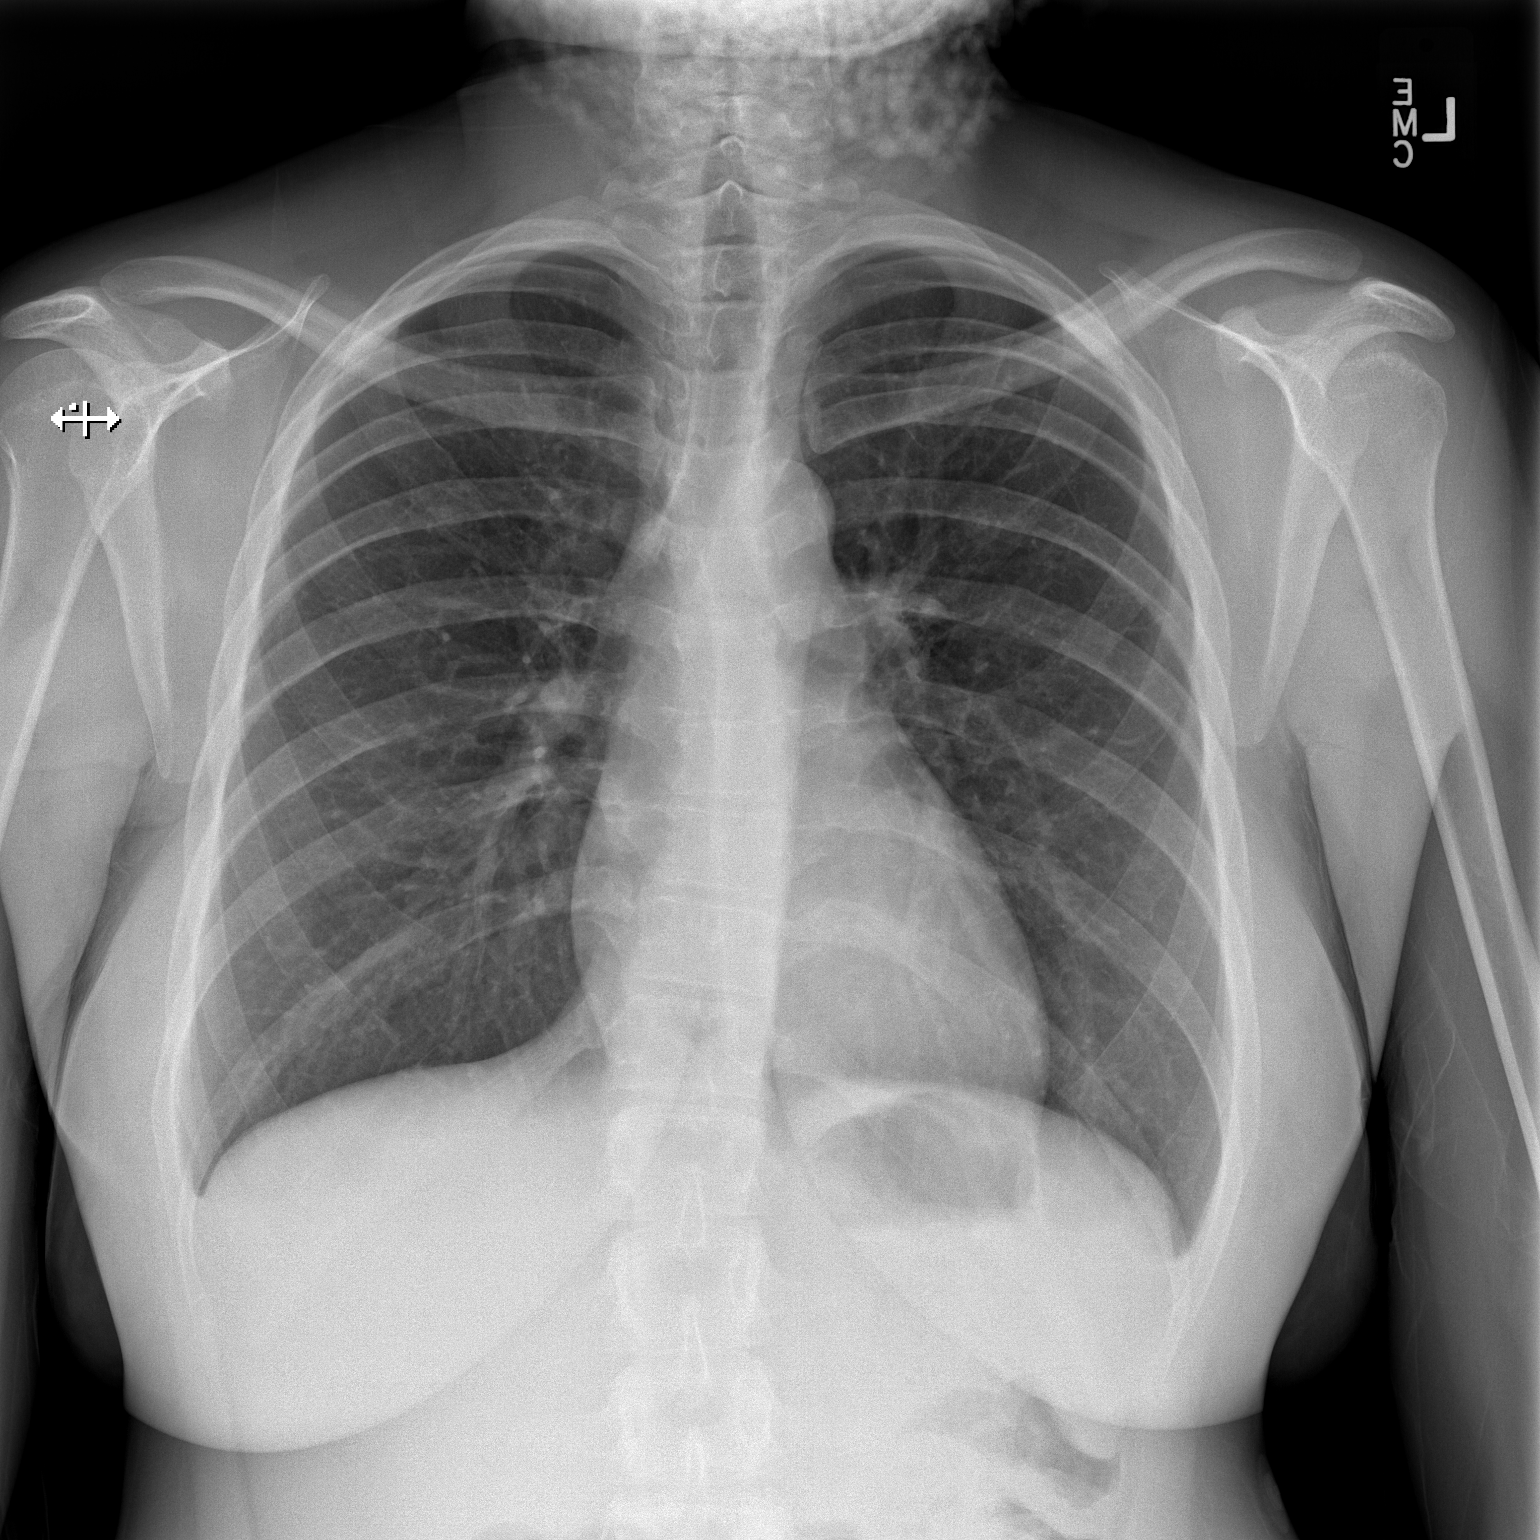

[w chest lat]
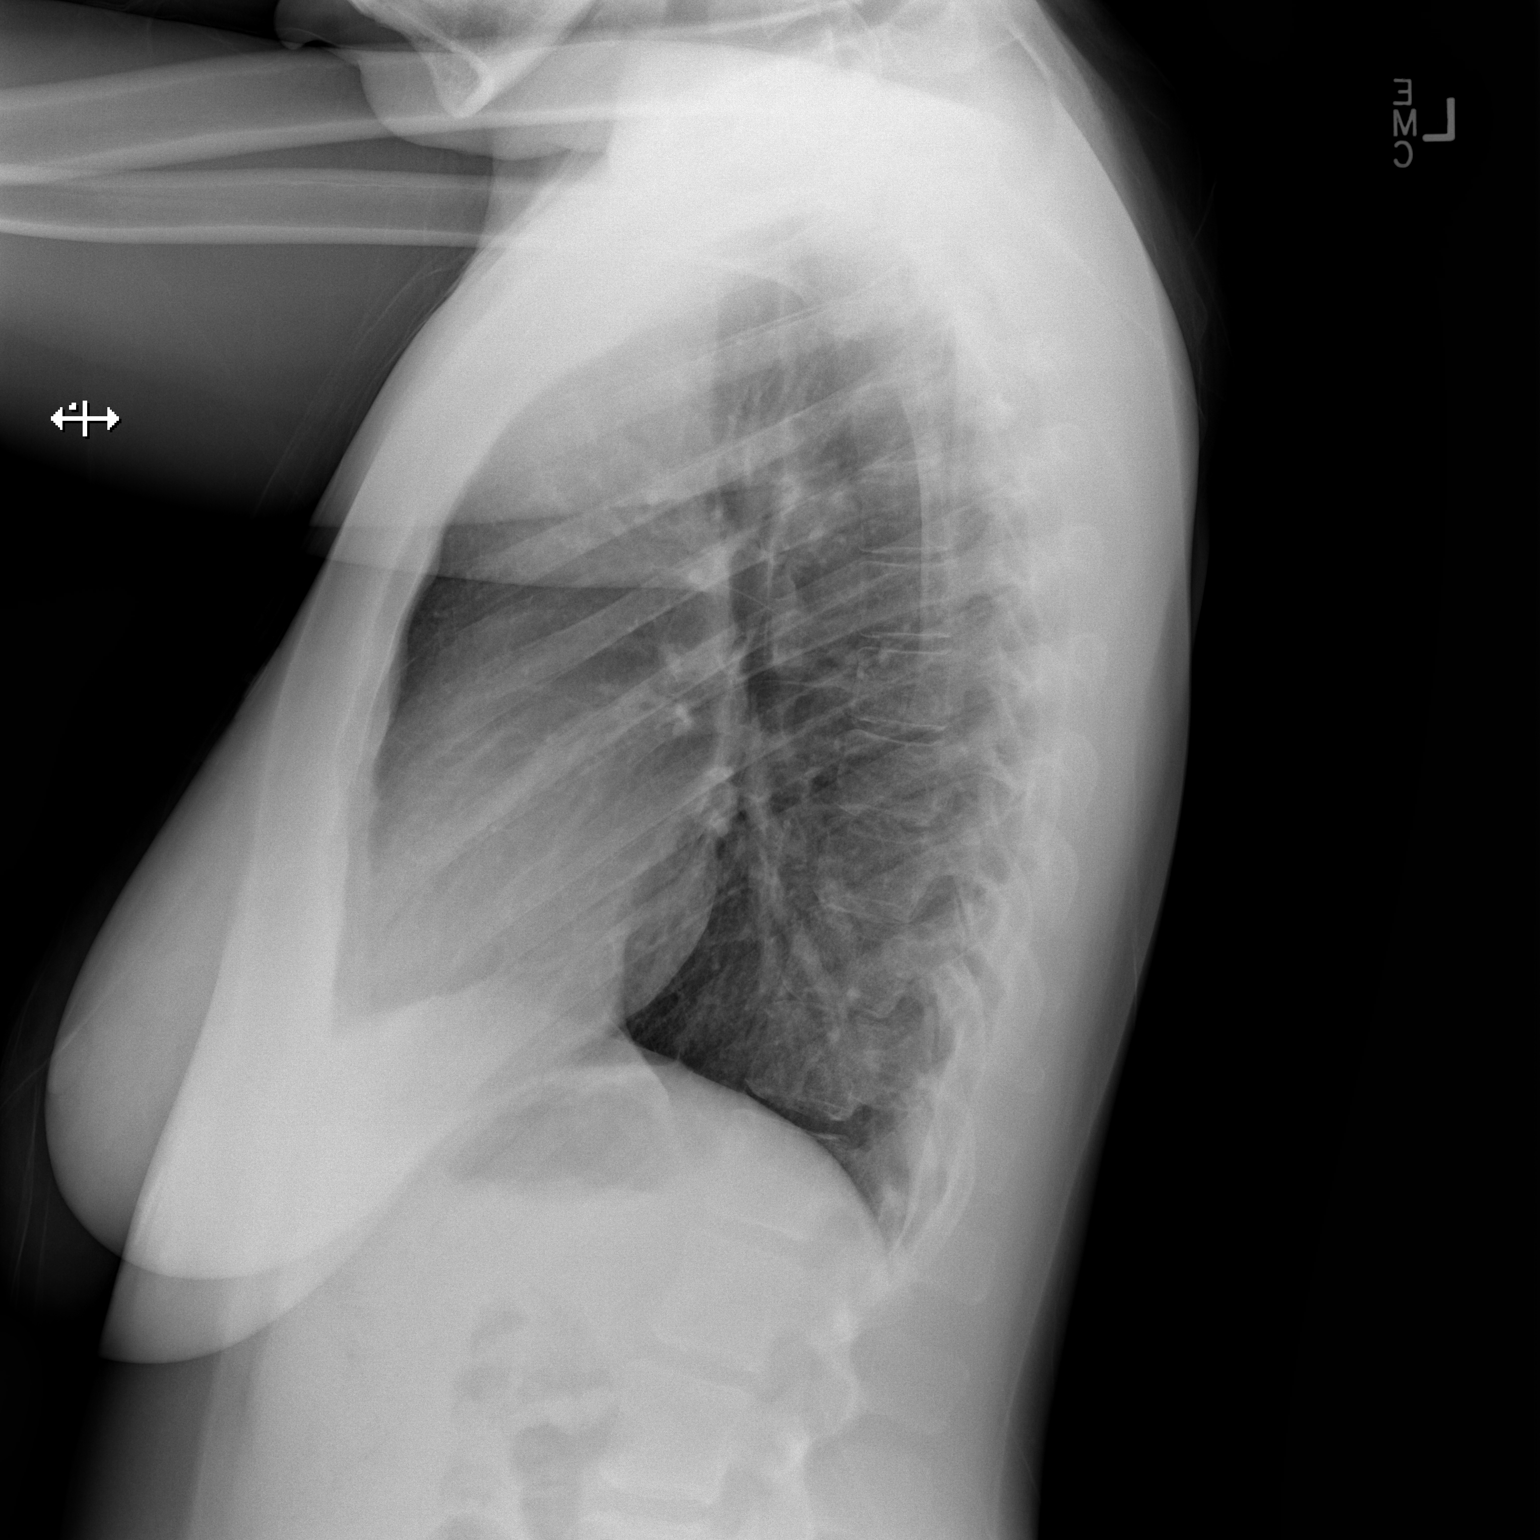

[2 of 2 positions shown; findings below may reference images not displayed]

FINDINGS: Cardiomediastinal silhouette is unremarkable. The lungs are clear
without pleural effusions or focal consolidations. Trachea projects
midline and there is no pneumothorax. Soft tissue planes and
included osseous structures are non-suspicious.
IMPRESSION: Normal chest.

By: Abdulquadir Mayana

## 2022-09-22 ENCOUNTER — Emergency Department (HOSPITAL_BASED_OUTPATIENT_CLINIC_OR_DEPARTMENT_OTHER): Payer: Medicaid Other

## 2022-09-22 ENCOUNTER — Encounter (HOSPITAL_BASED_OUTPATIENT_CLINIC_OR_DEPARTMENT_OTHER): Payer: Self-pay

## 2022-09-22 ENCOUNTER — Inpatient Hospital Stay (HOSPITAL_BASED_OUTPATIENT_CLINIC_OR_DEPARTMENT_OTHER)
Admission: EM | Admit: 2022-09-22 | Discharge: 2022-10-01 | DRG: 826 | Disposition: A | Discharge status: Home / Self Care | Payer: Medicaid Other | Attending: Internal Medicine | Admitting: Internal Medicine

## 2022-09-22 DIAGNOSIS — K567 Ileus, unspecified: Secondary | ICD-10-CM | POA: Diagnosis not present

## 2022-09-22 DIAGNOSIS — Z791 Long term (current) use of non-steroidal anti-inflammatories (NSAID): Secondary | ICD-10-CM

## 2022-09-22 DIAGNOSIS — I82611 Acute embolism and thrombosis of superficial veins of right upper extremity: Secondary | ICD-10-CM | POA: Diagnosis not present

## 2022-09-22 DIAGNOSIS — R64 Cachexia: Secondary | ICD-10-CM | POA: Diagnosis not present

## 2022-09-22 DIAGNOSIS — E861 Hypovolemia: Secondary | ICD-10-CM | POA: Diagnosis present

## 2022-09-22 DIAGNOSIS — K561 Intussusception: Secondary | ICD-10-CM | POA: Diagnosis present

## 2022-09-22 DIAGNOSIS — C7B8 Other secondary neuroendocrine tumors: Secondary | ICD-10-CM | POA: Diagnosis present

## 2022-09-22 DIAGNOSIS — Z681 Body mass index (BMI) 19 or less, adult: Secondary | ICD-10-CM | POA: Diagnosis not present

## 2022-09-22 DIAGNOSIS — D3A8 Other benign neuroendocrine tumors: Secondary | ICD-10-CM | POA: Insufficient documentation

## 2022-09-22 DIAGNOSIS — R1900 Intra-abdominal and pelvic swelling, mass and lump, unspecified site: Principal | ICD-10-CM

## 2022-09-22 DIAGNOSIS — K921 Melena: Secondary | ICD-10-CM | POA: Diagnosis not present

## 2022-09-22 DIAGNOSIS — E871 Hypo-osmolality and hyponatremia: Secondary | ICD-10-CM | POA: Diagnosis not present

## 2022-09-22 DIAGNOSIS — E43 Unspecified severe protein-calorie malnutrition: Secondary | ICD-10-CM | POA: Diagnosis not present

## 2022-09-22 DIAGNOSIS — Z79899 Other long term (current) drug therapy: Secondary | ICD-10-CM

## 2022-09-22 DIAGNOSIS — C7A8 Other malignant neuroendocrine tumors: Principal | ICD-10-CM | POA: Diagnosis present

## 2022-09-22 DIAGNOSIS — D500 Iron deficiency anemia secondary to blood loss (chronic): Secondary | ICD-10-CM | POA: Diagnosis not present

## 2022-09-22 DIAGNOSIS — E876 Hypokalemia: Secondary | ICD-10-CM | POA: Diagnosis not present

## 2022-09-22 DIAGNOSIS — Z800 Family history of malignant neoplasm of digestive organs: Secondary | ICD-10-CM

## 2022-09-22 DIAGNOSIS — D3A012 Benign carcinoid tumor of the ileum: Secondary | ICD-10-CM | POA: Insufficient documentation

## 2022-09-22 DIAGNOSIS — K6389 Other specified diseases of intestine: Secondary | ICD-10-CM | POA: Diagnosis present

## 2022-09-22 DIAGNOSIS — D649 Anemia, unspecified: Secondary | ICD-10-CM

## 2022-09-22 DIAGNOSIS — R109 Unspecified abdominal pain: Secondary | ICD-10-CM | POA: Diagnosis present

## 2022-09-22 LAB — PREGNANCY, URINE: Preg Test, Ur: NEGATIVE

## 2022-09-22 LAB — CBC
HCT: 18.8 % — ABNORMAL LOW (ref 36.0–46.0)
Hemoglobin: 5.3 g/dL — CL (ref 12.0–15.0)
MCH: 19 pg — ABNORMAL LOW (ref 26.0–34.0)
MCHC: 28.2 g/dL — ABNORMAL LOW (ref 30.0–36.0)
MCV: 67.4 fL — ABNORMAL LOW (ref 80.0–100.0)
Platelets: 458 10*3/uL — ABNORMAL HIGH (ref 150–400)
RBC: 2.79 MIL/uL — ABNORMAL LOW (ref 3.87–5.11)
RDW: 15.9 % — ABNORMAL HIGH (ref 11.5–15.5)
WBC: 7.4 10*3/uL (ref 4.0–10.5)
nRBC: 0 % (ref 0.0–0.2)

## 2022-09-22 LAB — COMPREHENSIVE METABOLIC PANEL
ALT: 8 U/L (ref 0–44)
AST: 16 U/L (ref 15–41)
Albumin: 4 g/dL (ref 3.5–5.0)
Alkaline Phosphatase: 30 U/L — ABNORMAL LOW (ref 38–126)
Anion gap: 8 (ref 5–15)
BUN: 9 mg/dL (ref 6–20)
CO2: 24 mmol/L (ref 22–32)
Calcium: 9.4 mg/dL (ref 8.9–10.3)
Chloride: 102 mmol/L (ref 98–111)
Creatinine, Ser: 0.54 mg/dL (ref 0.44–1.00)
GFR, Estimated: >60 mL/min (ref >60)
Glucose, Bld: 114 mg/dL — ABNORMAL HIGH (ref 70–99)
Potassium: 3 mmol/L — ABNORMAL LOW (ref 3.5–5.1)
Sodium: 134 mmol/L — ABNORMAL LOW (ref 135–145)
Total Bilirubin: 0.3 mg/dL (ref 0.3–1.2)
Total Protein: 7.4 g/dL (ref 6.5–8.1)

## 2022-09-22 LAB — RETICULOCYTES
Immature Retic Fract: 21.2 % — ABNORMAL HIGH (ref 2.3–15.9)
RBC.: 2.78 MIL/uL — ABNORMAL LOW (ref 3.87–5.11)
Retic Count, Absolute: 63.4 10*3/uL (ref 19.0–186.0)
Retic Ct Pct: 2.3 % (ref 0.4–3.1)

## 2022-09-22 LAB — URINALYSIS, ROUTINE W REFLEX MICROSCOPIC
Bilirubin Urine: NEGATIVE
Glucose, UA: NEGATIVE mg/dL
Hgb urine dipstick: NEGATIVE
Ketones, ur: 15 mg/dL — AB
Nitrite: NEGATIVE
Specific Gravity, Urine: 1.031 — ABNORMAL HIGH (ref 1.005–1.030)
pH: 6 (ref 5.0–8.0)

## 2022-09-22 LAB — FERRITIN: Ferritin: 2 ng/mL — ABNORMAL LOW (ref 11–307)

## 2022-09-22 LAB — OCCULT BLOOD X 1 CARD TO LAB, STOOL: Fecal Occult Bld: POSITIVE — AB

## 2022-09-22 LAB — LIPASE, BLOOD: Lipase: 23 U/L (ref 11–51)

## 2022-09-22 MED ORDER — MELATONIN 5 MG PO TABS
5.0000 mg | ORAL_TABLET | Freq: Every evening | ORAL | Status: DC | PRN
  Administered 2022-09-23 – 2022-09-30 (×8): 5 mg via ORAL
  Filled 2022-09-22 (×8): qty 1

## 2022-09-22 MED ORDER — POTASSIUM CHLORIDE IN NACL 40-0.9 MEQ/L-% IV SOLN
INTRAVENOUS | Status: DC
  Filled 2022-09-22: qty 1000

## 2022-09-22 MED ORDER — POTASSIUM CHLORIDE CRYS ER 20 MEQ PO TBCR
40.0000 meq | EXTENDED_RELEASE_TABLET | Freq: Once | ORAL | Status: AC
  Administered 2022-09-22: 40 meq via ORAL
  Filled 2022-09-22: qty 2

## 2022-09-22 MED ORDER — KETOROLAC TROMETHAMINE 15 MG/ML IJ SOLN
15.0000 mg | Freq: Once | INTRAMUSCULAR | Status: AC
Start: 2022-09-22 — End: 2022-09-22
  Administered 2022-09-22: 15 mg via INTRAVENOUS
  Filled 2022-09-22: qty 1

## 2022-09-22 MED ORDER — IOHEXOL 350 MG/ML SOLN
100.0000 mL | Freq: Once | INTRAVENOUS | Status: AC | PRN
  Administered 2022-09-22: 100 mL via INTRAVENOUS

## 2022-09-22 MED ORDER — SODIUM CHLORIDE 0.9% IV SOLUTION: Freq: Once | INTRAVENOUS | Status: DC

## 2022-09-22 MED ORDER — ACETAMINOPHEN 325 MG PO TABS: 650.0000 mg | ORAL_TABLET | Freq: Four times a day (QID) | ORAL | Status: DC | PRN

## 2022-09-22 MED ORDER — LORAZEPAM 1 MG PO TABS
0.5000 mg | ORAL_TABLET | Freq: Once | ORAL | Status: AC
  Administered 2022-09-22: 0.5 mg via ORAL
  Filled 2022-09-22: qty 1

## 2022-09-22 MED ORDER — PANTOPRAZOLE SODIUM 40 MG IV SOLR
40.0000 mg | Freq: Once | INTRAVENOUS | Status: AC
  Administered 2022-09-22: 40 mg via INTRAVENOUS
  Filled 2022-09-22: qty 10

## 2022-09-22 MED ORDER — POLYETHYLENE GLYCOL 3350 17 G PO PACK: 17.0000 g | PACK | Freq: Every day | ORAL | Status: DC | PRN

## 2022-09-22 MED ORDER — PROCHLORPERAZINE EDISYLATE 10 MG/2ML IJ SOLN
5.0000 mg | Freq: Four times a day (QID) | INTRAMUSCULAR | Status: DC | PRN
  Administered 2022-09-23 – 2022-09-27 (×3): 5 mg via INTRAVENOUS
  Filled 2022-09-22 (×3): qty 2

## 2022-09-22 MED ORDER — POTASSIUM CHLORIDE 10 MEQ/100ML IV SOLN
10.0000 meq | INTRAVENOUS | Status: AC
  Administered 2022-09-22 (×3): 10 meq via INTRAVENOUS
  Filled 2022-09-22 (×3): qty 100

## 2022-09-22 NOTE — ED Provider Notes (Signed)
[place] [facility] DE[facility]MENT AT [facility] Provider Note   CSN: [id number] Arrival date & time: 09/22/22  1530     History  Chief Complaint  Patient presents with   Abdominal Pain    Meghan Holland is a 37 y.o. female.  37 year old female presents today for concern of abdominal cramping.  States the pain radiates to her back.  States today she had 3 episodes of bloody stools today.  She denies melanotic stools.  She does have history of iron deficiency anemia.  States her menstrual cycle is not due for another 4 to 5 days.  She states her menstrual cycles are regular.  They are a lot of typically heavy.  She denies any vaginal bleeding.  Denies hematemesis or other source of bleeding.  No blood per rectum prior to today.  She states that has not been significant amount.  Denies any lightheadedness, chest pain, or shortness of breath.  The history is provided by the patient. No language interpreter was used.       Home Medications Prior to Admission medications   Medication Sig Start Date End Date Taking? Authorizing Provider  acetaminophen (TYLENOL) 500 MG tablet Take 1 tablet (500 mg total) by mouth every 6 (six) hours as needed. 08/19/14   Marlon Pel, PA-C  cyclobenzaprine (FLEXERIL) 5 MG tablet Take 1 tablet (5 mg total) by mouth 2 (two) times daily as needed for muscle spasms. 08/19/14   Marlon Pel, PA-C  ferrous fumarate (HEMOCYTE - 106 MG FE) 325 (106 FE) MG TABS tablet Take 1 tablet by mouth daily.     [provider]  ibuprofen (ADVIL,MOTRIN) 200 MG tablet Take 400 mg by mouth every 6 (six) hours as needed for moderate pain.    [provider]  naproxen (NAPROSYN) 500 MG tablet Take 1 tablet (500 mg total) by mouth 2 (two) times daily with a meal. 08/02/14   Marisa Severin, MD  Prenatal Vit-Fe Fumarate-FA (SE-NATAL 19) 29-1 MG CHEW Chew 1 tablet by mouth daily.  11/26/13   [provider]  ranitidine (ZANTAC) 150 MG tablet Take 1  tablet (150 mg total) by mouth 2 (two) times daily. 04/25/14   Gwyneth Sprout, MD  sucralfate (CARAFATE) 1 G tablet Take 1 tablet (1 g total) by mouth 4 (four) times daily -  with meals and at bedtime. 04/25/14   Gwyneth Sprout, MD      Allergies    Patient has no known allergies.    Review of Systems   Review of Systems  Constitutional:  Negative for fever.  Gastrointestinal:  Positive for abdominal pain and blood in stool. Negative for nausea and vomiting.  Genitourinary:  Negative for dysuria and flank pain.  All other systems reviewed and are negative.   Physical Exam Updated Vital Signs BP 120/79   Pulse 93   Temp 99 F (37.2 C) (Oral)   Resp 16   Ht 5\' 8"  (1.727 m)   Wt 44.6 kg   LMP 08/25/2022   SpO2 100%   BMI 14.93 kg/m  Physical Exam Vitals and nursing note reviewed.  Constitutional:      General: She is not in acute distress.    Appearance: Normal appearance. She is not ill-appearing.  HENT:     Head: Normocephalic and atraumatic.     Nose: Nose normal.  Eyes:     General: No scleral icterus.    Extraocular Movements: Extraocular movements intact.     Conjunctiva/sclera: Conjunctivae normal.  Cardiovascular:  Rate and Rhythm: Normal rate and regular rhythm.     Heart sounds: Normal heart sounds.  Pulmonary:     Effort: Pulmonary effort is normal. No respiratory distress.     Breath sounds: Normal breath sounds. No wheezing or rales.  Abdominal:     General: There is no distension.     Tenderness: There is abdominal tenderness. There is no right CVA tenderness, left CVA tenderness, guarding or rebound.  Musculoskeletal:        General: Normal range of motion.     Cervical back: Normal range of motion.  Skin:    General: Skin is warm and dry.  Neurological:     General: No focal deficit present.     Mental Status: She is alert. Mental status is at baseline.     ED Results / Procedures / Treatments   Labs (all labs ordered are listed, but  only abnormal results are displayed) Labs Reviewed  COMPREHENSIVE METABOLIC PANEL - Abnormal; Notable for the following components:      Result Value   Sodium 134 (*)    Potassium 3.0 (*)    Glucose, Bld 114 (*)    Alkaline Phosphatase 30 (*)    All other components within normal limits  CBC - Abnormal; Notable for the following components:   RBC 2.79 (*)    Hemoglobin 5.3 (*)    HCT 18.8 (*)    MCV 67.4 (*)    MCH 19.0 (*)    MCHC 28.2 (*)    RDW 15.9 (*)    Platelets 458 (*)    All other components within normal limits  RETICULOCYTES - Abnormal; Notable for the following components:   RBC. 2.78 (*)    Immature Retic Fract 21.2 (*)    All other components within normal limits  LIPASE, BLOOD  URINALYSIS, ROUTINE W REFLEX MICROSCOPIC  PREGNANCY, URINE  VITAMIN B12  FOLATE  IRON AND TIBC  FERRITIN  OCCULT BLOOD X 1 CARD TO LAB, STOOL    EKG None  Radiology No results found.  Procedures .Critical Care  Performed by: Marita Kansas, PA-C Authorized by: Marita Kansas, PA-C   Critical care provider statement:    Critical care time (minutes):  32   Critical care was necessary to treat or prevent imminent or life-threatening deterioration of the following conditions: hypokalemia, anemia.   Critical care was time spent personally by me on the following activities:  Development of treatment plan with patient or surrogate, discussions with consultants, evaluation of patient's response to treatment, examination of patient, ordering and review of laboratory studies, ordering and review of radiographic studies, ordering and performing treatments and interventions, pulse oximetry, re-evaluation of patient's condition and review of old charts   Care discussed with: admitting provider       Medications Ordered in ED Medications  potassium chloride 10 mEq in 100 mL IVPB (has no administration in time range)  potassium chloride SA (KLOR-CON M) CR tablet 40 mEq (has no administration in  time range)  ketorolac (TORADOL) 15 MG/ML injection 15 mg (has no administration in time range)    ED Course/ Medical Decision Making/ A&P                             Medical Decision Making Amount and/or Complexity of Data Reviewed Labs: ordered. Radiology: ordered.  Risk Prescription drug management. Decision regarding hospitalization.   Medical Decision Making / ED Course   This  patient presents to the ED for concern of abdominal pain, this involves an extensive number of treatment options, and is a complaint that carries with it a high risk of complications and morbidity.  The differential diagnosis includes gastroenteritis, diverticulitis, appendicitis, cholecystitis, menstrual cramps  MDM: 37 year old female presents for concern of abdominal cramping.  Denies other complaints.  She is hemodynamically stable however her blood work shows hemoglobin of 5.3.  Last 1 to compare this to is from 8 years ago.  She states that is last time she had blood work done.  Her hemoglobin at that time was around her baseline of 10-11.  States her menstrual cycles are typically not heavy.  Outside of the 3 episodes of blood per rectum today she has not had any other source of bleeding.  She is not on any iron supplement.  She does have history of iron deficiency anemia.  She also has hypokalemia 3.0 today on CMP.  No other acute findings.  Will give p.o. and IV repletion.  We do not have blood to transfuse at the [facility] for this indication.  Will obtain angio study and then discussed with hospitalist for admission for GI workup as well as transfusion.  CT shows evidence of intussusception with abdominal mass.  Discussed with general surgery.  They will evaluate patient once patient is transferred to [facility], or [facility].  They are okay with either location.  Also discussed with gastroenterology.  They will follow.  Patient states she would like to wait on blood transfusion until she is  transported and gets to speak with the primary team.  She is unable to quantify what she wants to defer on blood transfusion.  She understands the importance of getting the blood transfusion.  She understand they may not be able to perform surgery until she gets blood transfusion.  Discussed with hospitalist will accept patient for admission.  She is hemodynamically stable.   Lab Tests: -I ordered, reviewed, and interpreted labs.   The pertinent results include:   Labs Reviewed  COMPREHENSIVE METABOLIC PANEL - Abnormal; Notable for the following components:      Result Value   Sodium 134 (*)    Potassium 3.0 (*)    Glucose, Bld 114 (*)    Alkaline Phosphatase 30 (*)    All other components within normal limits  CBC - Abnormal; Notable for the following components:   RBC 2.79 (*)    Hemoglobin 5.3 (*)    HCT 18.8 (*)    MCV 67.4 (*)    MCH 19.0 (*)    MCHC 28.2 (*)    RDW 15.9 (*)    Platelets 458 (*)    All other components within normal limits  RETICULOCYTES - Abnormal; Notable for the following components:   RBC. 2.78 (*)    Immature Retic Fract 21.2 (*)    All other components within normal limits  LIPASE, BLOOD  URINALYSIS, ROUTINE W REFLEX MICROSCOPIC  PREGNANCY, URINE  VITAMIN B12  FOLATE  IRON AND TIBC  FERRITIN  OCCULT BLOOD X 1 CARD TO LAB, STOOL      EKG  EKG Interpretation Date/Time:    Ventricular Rate:    PR Interval:    QRS Duration:    QT Interval:    QTC Calculation:   R Axis:      Text Interpretation:           Imaging Studies ordered: I ordered imaging studies including CT angio GI bleed study I  independently visualized and interpreted imaging. I agree with the radiologist interpretation   Medicines ordered and prescription drug management: Meds ordered this encounter  Medications   potassium chloride 10 mEq in 100 mL IVPB   potassium chloride SA (KLOR-CON M) CR tablet 40 mEq   ketorolac (TORADOL) 15 MG/ML injection 15 mg    -I  have reviewed the patients home medicines and have made adjustments as needed  Critical interventions Potassium repletion, surgical consult  Consultations Obtained: I requested consultation with the surgery, GI.  Dr. Royanne Foots, and Dr. Ewing Schlein  and discussed lab and imaging findings as well as pertinent plan - they recommend: As above   Cardiac Monitoring: The patient was maintained on a cardiac monitor.  I personally viewed and interpreted the cardiac monitored which showed an underlying rhythm of: Normal sinus rhythm   Reevaluation: After the interventions noted above, I reevaluated the patient and found that they have :stayed the same  Co morbidities that complicate the patient evaluation  Past Medical History:  Diagnosis Date   Medical history non-contributory    Palpitations    negative echo       Dispostion: Discussed with hospitalist who will accept patient for admission  Final Clinical Impression(s) / ED Diagnoses Final diagnoses:  Abdominal mass, unspecified abdominal location  Intussusception (HCC)  Anemia, unspecified type  Hypokalemia    Rx / DC Orders ED Discharge Orders     None         Marita Kansas, PA-C 09/22/22 2211    Virgina Norfolk, DO 09/22/22 2253

## 2022-09-22 NOTE — ED Notes (Signed)
Unable to provide UA

## 2022-09-22 NOTE — H&P (Incomplete)
History and Physical  REAGIN LABRANCHE Holland:[id number] DOB: [birth year] DOA: 09/22/2022  Referring physician: Accepted by Dr. Julian Reil [facility], hospitalist service. PCP: Patient, No Pcp Per  Outpatient Specialists: None. Patient coming from: Home through [facility] ED.  Chief Complaint: Abdominal pain.  HPI: Meghan Holland is a 37 y.o. female with medical history significant for iron deficiency anemia, who initially presented to [facility] with complaints of abdominal cramping radiating to her back.  Associated with 3 episodes of bloody stools today.  Denies hematemesis ordered source of bleeding.  No prior history of GI bleed.  She presented to the ED for further evaluation.  In the ED, the patient was noted to be cachectic with a BMI of 15, mild tachypnea 25.  Hemoglobin 5.3 with positive FOBT.  CT angio completed revealed the following findings:  1. Very abnormal appearance of the mid small bowel, with a long segment small bowel intussusception, and a heterogeneously hyperenhancing mass lesion about the intussuscepted lead segment in the central abdomen measuring 4.0 x 3.7 cm. Multiple air and fluid-filled, although not overtly distended loops of small bowel in proximity to this segment, however without overt bowel obstruction. 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 2. Hypoenhancing lesion of the peripheral right lobe of the liver, incompletely characterized but suspicious for a metastasis. Recommend multiphasic contrast enhanced MR to better characterize. 3. Small volume free fluid throughout the abdomen. 4. No evidence of aortic aneurysm, dissection, or other acute aortic pathology. The branch vessel origins are patent, with specific attention to the superior mesenteric artery, which is widely patent through its origin and proximal order branch vessels. No significant atherosclerosis.  EDP discussed the case with general  surgery and GI who recommended transfer to either [facility] or [facility] for further management.  The patient was admitted by [facility], Dr. Julian Reil, and transferred to [facility] progressive care unit as observation status.  ED Course: Temperature 98.6.  BP 116/81, pulse 73, respiratory 17, O2 saturation 100% on room air.  Lab studies markable for serum sodium 134, potassium 3.0, glucose 114.  Ferritin level 2.  Hemoglobin 5.3, platelet count 458, WBC 7.4.  Review of Systems: Review of systems as noted in the HPI. All other systems reviewed and are negative.   Past Medical History:  Diagnosis Date   Medical history non-contributory    Palpitations    negative echo    Past Surgical History:  Procedure Laterality Date   NO PAST SURGERIES      Social History:  reports that she has never smoked. She has never used smokeless tobacco. She reports that she does not drink alcohol and does not use drugs.   No Known Allergies  Family History  Problem Relation Age of Onset   Hypertension Mother    Hypertension Sister    Diabetes Maternal Grandmother    Cancer Maternal Grandmother        malignant breast tumor   Heart disease Neg Hx       Prior to Admission medications   Medication Sig Start Date End Date Taking? Authorizing Provider  acetaminophen (TYLENOL) 500 MG tablet Take 1 tablet (500 mg total) by mouth every 6 (six) hours as needed. 08/19/14   Marlon Pel, PA-C  cyclobenzaprine (FLEXERIL) 5 MG tablet Take 1 tablet (5 mg total) by mouth 2 (two) times daily as needed for muscle spasms. 08/19/14   Marlon Pel, PA-C  ferrous fumarate (HEMOCYTE - 106 MG FE)  325 (106 FE) MG TABS tablet Take 1 tablet by mouth daily.     [provider]  ibuprofen (ADVIL,MOTRIN) 200 MG tablet Take 400 mg by mouth every 6 (six) hours as needed for moderate pain.    [provider]  naproxen (NAPROSYN) 500 MG tablet Take 1 tablet (500 mg total) by mouth 2 (two) times  daily with a meal. 08/02/14   Marisa Severin, MD  Prenatal Vit-Fe Fumarate-FA (SE-NATAL 19) 29-1 MG CHEW Chew 1 tablet by mouth daily.  11/26/13   [provider]  ranitidine (ZANTAC) 150 MG tablet Take 1 tablet (150 mg total) by mouth 2 (two) times daily. 04/25/14   Gwyneth Sprout, MD  sucralfate (CARAFATE) 1 G tablet Take 1 tablet (1 g total) by mouth 4 (four) times daily -  with meals and at bedtime. 04/25/14   Gwyneth Sprout, MD    Physical Exam: BP 117/80   Pulse 77   Temp 98.7 F (37.1 C) (Oral)   Resp 19   Ht 5\' 8"  (1.727 m)   Wt 44.6 kg   LMP 08/25/2022   SpO2 100%   BMI 14.93 kg/m   General: 37 y.o. year-old female well developed well nourished in no acute distress.  Alert and oriented x3. Cardiovascular: Regular rate and rhythm with no rubs or gallops.  No thyromegaly or JVD noted.  No lower extremity edema. 2/4 pulses in all 4 extremities. Respiratory: Clear to auscultation with no wheezes or rales. Good inspiratory effort. Abdomen: Soft nontender nondistended with normal bowel sounds x4 quadrants. Muskuloskeletal: No cyanosis, clubbing or edema noted bilaterally Neuro: CN II-XII intact, strength, sensation, reflexes Skin: No ulcerative lesions noted or rashes Psychiatry: Judgement and insight appear normal. Mood is appropriate for condition and setting          Labs on Admission:  Basic Metabolic Panel: Recent Labs  Lab 09/22/22 1546  NA 134*  K 3.0*  CL 102  CO2 24  GLUCOSE 114*  BUN 9  CREATININE 0.54  CALCIUM 9.4   Liver Function Tests: Recent Labs  Lab 09/22/22 1546  AST 16  ALT 8  ALKPHOS 30*  BILITOT 0.3  PROT 7.4  ALBUMIN 4.0   Recent Labs  Lab 09/22/22 1546  LIPASE 23   No results for input(s): "AMMONIA" in the last 168 hours. CBC: Recent Labs  Lab 09/22/22 1546  WBC 7.4  HGB 5.3*  HCT 18.8*  MCV 67.4*  PLT 458*   Cardiac Enzymes: No results for input(s): "CKTOTAL", "CKMB", "CKMBINDEX", "TROPONINI" in the last 168  hours.  BNP (last 3 results) No results for input(s): "BNP" in the last 8760 hours.  ProBNP (last 3 results) No results for input(s): "PROBNP" in the last 8760 hours.  CBG: No results for input(s): "GLUCAP" in the last 168 hours.  Radiological Exams on Admission: CT ANGIO GI BLEED  Result Date: 09/22/2022 CLINICAL DATA:  Mesenteric ischemia, abdominal pain radiating to back, nausea, vomiting, diarrhea * Tracking Code: BO * EXAM: CTA ABDOMEN AND PELVIS WITHOUT AND WITH CONTRAST TECHNIQUE: Multidetector CT imaging of the abdomen and pelvis was performed using the standard protocol during bolus administration of intravenous contrast. Multiplanar reconstructed images and MIPs were obtained and reviewed to evaluate the vascular anatomy. RADIATION DOSE REDUCTION: This exam was performed according to the departmental dose-optimization program which includes automated exposure control, adjustment of the mA and/or kV according to patient size and/or use of iterative reconstruction technique. CONTRAST:  OMNIPAQUE IOHEXOL 350 MG/ML SOLN COMPARISON:  None Available. FINDINGS: VASCULAR Normal contour and caliber of the abdominal aorta. No evidence of aneurysm, dissection, or other acute aortic pathology. Standard branching pattern of the abdominal aorta, with solitary bilateral renal arteries. The branch vessel origins are patent, with specific attention to the superior mesenteric artery, which is widely patent through its origin and proximal order branch vessels. Review of the MIP images confirms the above findings. NON-VASCULAR Lower chest: No acute abnormality. Hepatobiliary: Hypoenhancing subcapsular lesion of the peripheral right lobe of the liver, hepatic segment VII/VIII, measuring 3.0 x 2.5 cm (series 11, image 17). No gallstones, gallbladder wall thickening, or biliary dilatation. Pancreas: Unremarkable. No pancreatic ductal dilatation or surrounding inflammatory changes. Spleen: Normal in size  without significant abnormality. Adrenals/Urinary Tract: Adrenal glands are unremarkable. Kidneys are normal, without renal calculi, solid lesion, or hydronephrosis. Bladder is unremarkable. Stomach/Bowel: Stomach is within normal limits. Very abnormal appearance of the mid small bowel, with a long segment small bowel small bowel intussusception, and a heterogeneously hyperenhancing mass lesion about the intussuscepted lead segment in the central abdomen measuring 4.0 x 3.7 cm (series 14, image 29). Multiple air and fluid-filled, although not overtly distended loops of small bowel in proximity to this segment, however without overt bowel obstruction. Lymphatic: No significant vascular findings are present. No enlarged abdominal or pelvic lymph nodes. Reproductive: No mass or other significant abnormality. Other: No abdominal wall hernia or abnormality. Small volume free fluid throughout the abdomen. Musculoskeletal: No acute or significant osseous findings. IMPRESSION: 1. Very abnormal appearance of the mid small bowel, with a long segment small bowel intussusception, and a heterogeneously hyperenhancing mass lesion about the intussuscepted lead segment in the central abdomen measuring 4.0 x 3.7 cm. Multiple air and fluid-filled, although not overtly distended loops of small bowel in proximity to this segment, however without overt bowel obstruction. 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 2. Hypoenhancing lesion of the peripheral right lobe of the liver, incompletely characterized but suspicious for a metastasis. Recommend multiphasic contrast enhanced MR to better characterize. 3. Small volume free fluid throughout the abdomen. 4. No evidence of aortic aneurysm, dissection, or other acute aortic pathology. The branch vessel origins are patent, with specific attention to the superior mesenteric artery, which is widely patent through its origin and  proximal order branch vessels. No significant atherosclerosis. Electronically Signed   By: Jearld Lesch M.D.   On: 09/22/2022 20:43    EKG: I independently viewed the EKG done and my findings are as followed: None available at the time of the visit.  Assessment/Plan Present on Admission:  Intussusception of small bowel (HCC)  Principal Problem:   Intussusception of small bowel (HCC)  Intussusception of small bowel, rule out intra-abdominal mass General surgery and GI consulted Keep n.p.o. until seen by specialists Supportive care, IV fluids, IV antiemetics, as needed analgesics  Severe iron deficiency anemia Acute blood loss anemia Presented with hemoglobin of 5.3, severe iron deficiency with ferritin of 2 Positive FOBT 2 units PRBC ordered to be transfused Repeat CBC post blood transfusion.  Severe protein calorie malnutrition, cachexia BMI 15 Severe muscle mass loss Encourage oral protein calorie intake when no longer n.p.o. Replete electrolytes as indicated Obtain phosphorus level in the morning Currently n.p.o. until seen by GI with general surgery.  Hypovolemic hyponatremia Serum sodium 134 IV fluid hydration NS KCl at 40 mill equivalent at 100 cc/h x 1 day Repeat BMP in the morning  Moderate hypokalemia Serum potassium 3.0 Repleted intravenously  Check magnesium level Repeat BMP in the morning    DVT prophylaxis: SCDs   Code Status: Full code   Family Communication: None at bedside  Disposition Plan: Admitted to progressive care unit  Consults called: General surgery and GI  Admission status: Observation status   Status is: Observation    Darlin Drop MD [facility] Pager [phone]  If 7PM-7AM, please contact night-coverage [email or web address] Password [email or web address]  09/22/2022, 11:13 PM

## 2022-09-22 NOTE — ED Notes (Signed)
CRITICAL VALUE STICKER  CRITICAL VALUE:hg 5.3  RECEIVER (on-site recipient of call):Melodye Ped  DATE & TIME NOTIFIED: 09/22/2022 1606  MESSENGER (representative from lab):  MD NOTIFIED: Dr. Virgina Norfolk  TIME OF NOTIFICATION:1613  RESPONSE:

## 2022-09-22 NOTE — ED Triage Notes (Signed)
Pt states that she is having abd pain that radiates to her back. Pt states that you can see the cramps. Pt endorses N/V/D.

## 2022-09-23 ENCOUNTER — Other Ambulatory Visit: Payer: Self-pay

## 2022-09-23 DIAGNOSIS — K567 Ileus, unspecified: Secondary | ICD-10-CM | POA: Diagnosis not present

## 2022-09-23 DIAGNOSIS — E876 Hypokalemia: Secondary | ICD-10-CM

## 2022-09-23 DIAGNOSIS — I82611 Acute embolism and thrombosis of superficial veins of right upper extremity: Secondary | ICD-10-CM | POA: Diagnosis not present

## 2022-09-23 DIAGNOSIS — D649 Anemia, unspecified: Secondary | ICD-10-CM | POA: Diagnosis not present

## 2022-09-23 DIAGNOSIS — R109 Unspecified abdominal pain: Secondary | ICD-10-CM | POA: Diagnosis present

## 2022-09-23 DIAGNOSIS — K561 Intussusception: Secondary | ICD-10-CM | POA: Diagnosis present

## 2022-09-23 DIAGNOSIS — M7989 Other specified soft tissue disorders: Secondary | ICD-10-CM | POA: Diagnosis not present

## 2022-09-23 DIAGNOSIS — E43 Unspecified severe protein-calorie malnutrition: Secondary | ICD-10-CM | POA: Diagnosis present

## 2022-09-23 DIAGNOSIS — E861 Hypovolemia: Secondary | ICD-10-CM | POA: Diagnosis present

## 2022-09-23 DIAGNOSIS — D500 Iron deficiency anemia secondary to blood loss (chronic): Secondary | ICD-10-CM | POA: Diagnosis present

## 2022-09-23 DIAGNOSIS — E871 Hypo-osmolality and hyponatremia: Secondary | ICD-10-CM | POA: Diagnosis present

## 2022-09-23 DIAGNOSIS — Z79899 Other long term (current) drug therapy: Secondary | ICD-10-CM | POA: Diagnosis not present

## 2022-09-23 DIAGNOSIS — K6389 Other specified diseases of intestine: Secondary | ICD-10-CM | POA: Diagnosis not present

## 2022-09-23 DIAGNOSIS — Z681 Body mass index (BMI) 19 or less, adult: Secondary | ICD-10-CM | POA: Diagnosis not present

## 2022-09-23 DIAGNOSIS — C7B8 Other secondary neuroendocrine tumors: Secondary | ICD-10-CM | POA: Diagnosis present

## 2022-09-23 DIAGNOSIS — R64 Cachexia: Secondary | ICD-10-CM | POA: Diagnosis present

## 2022-09-23 DIAGNOSIS — K921 Melena: Secondary | ICD-10-CM | POA: Diagnosis present

## 2022-09-23 DIAGNOSIS — C7A8 Other malignant neuroendocrine tumors: Secondary | ICD-10-CM | POA: Diagnosis not present

## 2022-09-23 DIAGNOSIS — Z800 Family history of malignant neoplasm of digestive organs: Secondary | ICD-10-CM | POA: Diagnosis not present

## 2022-09-23 DIAGNOSIS — Z791 Long term (current) use of non-steroidal anti-inflammatories (NSAID): Secondary | ICD-10-CM | POA: Diagnosis not present

## 2022-09-23 DIAGNOSIS — K56609 Unspecified intestinal obstruction, unspecified as to partial versus complete obstruction: Secondary | ICD-10-CM | POA: Diagnosis not present

## 2022-09-23 LAB — ABO/RH: ABO/RH(D): B POS

## 2022-09-23 LAB — PREPARE RBC (CROSSMATCH)

## 2022-09-23 LAB — PHOSPHORUS: Phosphorus: 3.5 mg/dL (ref 2.5–4.6)

## 2022-09-23 LAB — FOLATE: Folate: 7.9 ng/mL (ref >5.9)

## 2022-09-23 LAB — IRON AND TIBC
Iron: 5 ug/dL — ABNORMAL LOW (ref 28–170)
Saturation Ratios: 1 % — ABNORMAL LOW (ref 10.4–31.8)
TIBC: 508 ug/dL — ABNORMAL HIGH (ref 250–450)
UIBC: 503 ug/dL

## 2022-09-23 LAB — BASIC METABOLIC PANEL WITH GFR
Anion gap: 8 (ref 5–15)
BUN: 6 mg/dL (ref 6–20)
CO2: 19 mmol/L — ABNORMAL LOW (ref 22–32)
Calcium: 8.8 mg/dL — ABNORMAL LOW (ref 8.9–10.3)
Chloride: 106 mmol/L (ref 98–111)
Creatinine, Ser: 0.52 mg/dL (ref 0.44–1.00)
GFR, Estimated: >60 mL/min
Glucose, Bld: 110 mg/dL — ABNORMAL HIGH (ref 70–99)
Potassium: 3.6 mmol/L (ref 3.5–5.1)
Sodium: 133 mmol/L — ABNORMAL LOW (ref 135–145)

## 2022-09-23 LAB — CBC
HCT: 20.9 % — ABNORMAL LOW (ref 36.0–46.0)
Hemoglobin: 5.6 g/dL — CL (ref 12.0–15.0)
MCH: 18.5 pg — ABNORMAL LOW (ref 26.0–34.0)
MCHC: 26.8 g/dL — ABNORMAL LOW (ref 30.0–36.0)
MCV: 69.2 fL — ABNORMAL LOW (ref 80.0–100.0)
Platelets: 509 10*3/uL — ABNORMAL HIGH (ref 150–400)
RBC: 3.02 MIL/uL — ABNORMAL LOW (ref 3.87–5.11)
RDW: 16.3 % — ABNORMAL HIGH (ref 11.5–15.5)
WBC: 8.7 10*3/uL (ref 4.0–10.5)
nRBC: 0 % (ref 0.0–0.2)

## 2022-09-23 LAB — HEMOGLOBIN AND HEMATOCRIT, BLOOD
HCT: 22 % — ABNORMAL LOW (ref 36.0–46.0)
Hemoglobin: 6.6 g/dL — CL (ref 12.0–15.0)

## 2022-09-23 LAB — MAGNESIUM: Magnesium: 1.8 mg/dL (ref 1.7–2.4)

## 2022-09-23 LAB — HIV ANTIBODY (ROUTINE TESTING W REFLEX): HIV Screen 4th Generation wRfx: NONREACTIVE

## 2022-09-23 LAB — VITAMIN B12: Vitamin B-12: 250 pg/mL (ref 180–914)

## 2022-09-23 MED ORDER — SODIUM CHLORIDE 0.9% IV SOLUTION: Freq: Once | INTRAVENOUS | Status: AC

## 2022-09-23 MED ORDER — OXYCODONE HCL 5 MG PO TABS
5.0000 mg | ORAL_TABLET | Freq: Four times a day (QID) | ORAL | Status: DC | PRN
  Administered 2022-09-23: 5 mg via ORAL
  Filled 2022-09-23: qty 1

## 2022-09-23 MED ORDER — ORAL CARE MOUTH RINSE: 15.0000 mL | OROMUCOSAL | Status: DC | PRN

## 2022-09-23 MED ORDER — SODIUM CHLORIDE 0.9 % IV SOLN
510.0000 mg | Freq: Once | INTRAVENOUS | Status: AC
  Administered 2022-09-23: 510 mg via INTRAVENOUS
  Filled 2022-09-23: qty 17

## 2022-09-23 MED ORDER — DEXTROSE IN LACTATED RINGERS 5 % IV SOLN: INTRAVENOUS | Status: AC

## 2022-09-23 MED ORDER — SODIUM CHLORIDE 0.9 % IV SOLN: INTRAVENOUS | Status: DC

## 2022-09-23 NOTE — Consult Note (Signed)
Surgical Evaluation Requesting provider: Dr. Mauro Kaufmann  Chief Complaint: GI bleeding, weight loss, abdominal pain  HPI: 37 year old woman with no known medical problems and no previous abdominal surgery who [facility] with complaint of abdominal pain radiating to her back the quality is cramping.  This has been associated with 3 episodes of bloody bowel movements on the day of presentation, but no other overt GI bleeding.  Denied melanotic stools.  Does have a history of iron deficiency anemia but no history of menorrhagia, no hematemesis.  No lightheadedness, chest pain, shortness of breath.  She has not been vomiting or particularly nauseous, but has noted significantly decreased appetite and cramping pain after meals for several months.  She had symptoms similar to this when she was pregnant in 2015, but had been otherwise doing well until last year around August when she started losing weight, reporting a 40 pound weight loss, unintentional, since that time.  Around April of this year the abdominal pain symptoms began.  She has been maintaining a mostly liquid diet.   She was found to be quite anemic with a hemoglobin of 5.3, but per notes patient declined blood transfusion on the basis of wanting to speak to the physicians at the hospital prior to this.  When she arrived she was seen by Dr. Margo Aye who did order blood and explained to the patient why this was necessary, however the patient then declined on the basis of wanting explanation from "specialists "prior to receiving blood and therefore dose of Feraheme was ordered in the meantime. Imaging has revealed a small bowel intussusception with a mass and a possible liver lesion concerning for metastatic disease.   No Known Allergies  Past Medical History:  Diagnosis Date   Medical history non-contributory    Palpitations    negative echo     Past Surgical History:  Procedure Laterality Date   NO  PAST SURGERIES      Family History  Problem Relation Age of Onset   Hypertension Mother    Hypertension Sister    Diabetes Maternal Grandmother    Cancer Maternal Grandmother        malignant breast tumor   Heart disease Neg Hx     Social History   Socioeconomic History   Marital status: Single    Spouse name: Not on file   Number of children: Not on file   Years of education: Not on file   Highest education level: Not on file  Occupational History   Not on file  Tobacco Use   Smoking status: Never   Smokeless tobacco: Never  Substance and Sexual Activity   Alcohol use: No    Comment: occasional   Drug use: No   Sexual activity: Yes    Birth control/protection: None  Other Topics Concern   Not on file  Social History Narrative   Not on file   Social Determinants of Health   Financial Resource Strain: Not on file  Food Insecurity: No Food Insecurity (09/22/2022)   Hunger Vital Sign    Worried About Running Out of Food in the Last Year: Never true    Ran Out of Food in the Last Year: Never true  Transportation Needs: No Transportation Needs (09/22/2022)   PRAPARE - Administrator, Civil Service (Medical): No    Lack of Transportation (Non-Medical): No  Physical Activity: Not on file  Stress: Not on file  Social Connections: Not on file  No current facility-administered medications on file prior to encounter.   Current Outpatient Medications on File Prior to Encounter  Medication Sig Dispense Refill   acetaminophen (TYLENOL) 500 MG tablet Take 1 tablet (500 mg total) by mouth every 6 (six) hours as needed. 30 tablet 0    Review of Systems: a complete, 10pt review of systems was completed with pertinent positives and negatives as documented in the HPI  Physical Exam: Vitals:   09/22/22 2317 09/23/22 0550  BP: 116/81 100/62  Pulse: 73 87  Resp: 17 19  Temp: 98.6 F (37 C) 98.7 F (37.1 C)  SpO2: 100% 100%   Gen: A&Ox3, no distress  Eyes:  lids and conjunctivae normal, no icterus Chest: respiratory effort is normal.  Cardiovascular: RRR with palpable distal pulses, no pedal edema Gastrointestinal: soft, nondistended, mildly tender in the central and lower fields without peritoneal signs, organomegaly or palpable mass at this time Muscoloskeletal: no clubbing or cyanosis of the fingers.  Strength is symmetrical throughout.  Range of motion of bilateral upper and lower extremities normal without pain, crepitation or contracture. Neuro: cranial nerves grossly intact.  Sensation intact to light touch diffusely. Psych: appropriate mood and affect, normal insight/judgment intact  Skin: warm and dry      Latest Ref Rng & Units 09/23/2022   12:20 AM 09/22/2022    3:46 PM 08/02/2014    3:37 AM  CBC  WBC 4.0 - 10.5 K/uL 8.7  7.4  6.3   Hemoglobin 12.0 - 15.0 g/dL 5.6  5.3  96.0   Hematocrit 36.0 - 46.0 % 20.9  18.8  32.8   Platelets 150 - 400 K/uL 509  458  275        Latest Ref Rng & Units 09/23/2022   12:20 AM 09/22/2022    3:46 PM 08/02/2014    3:37 AM  CMP  Glucose 70 - 99 mg/dL 454  098  98   BUN 6 - 20 mg/dL 6  9  12    Creatinine 0.44 - 1.00 mg/dL 1.19  1.47  8.29   Sodium 135 - 145 mmol/L 133  134  137   Potassium 3.5 - 5.1 mmol/L 3.6  3.0  3.4   Chloride 98 - 111 mmol/L 106  102  106   CO2 22 - 32 mmol/L 19  24  23    Calcium 8.9 - 10.3 mg/dL 8.8  9.4  8.8   Total Protein 6.5 - 8.1 g/dL  7.4    Total Bilirubin 0.3 - 1.2 mg/dL  0.3    Alkaline Phos 38 - 126 U/L  30    AST 15 - 41 U/L  16    ALT 0 - 44 U/L  8      No results found for: "INR", "PROTIME"  Imaging: CT ANGIO GI BLEED  Result Date: 09/22/2022 CLINICAL DATA:  Mesenteric ischemia, abdominal pain radiating to back, nausea, vomiting, diarrhea * Tracking Code: BO * EXAM: CTA ABDOMEN AND PELVIS WITHOUT AND WITH CONTRAST TECHNIQUE: Multidetector CT imaging of the abdomen and pelvis was performed using the standard protocol during bolus administration of intravenous  contrast. Multiplanar reconstructed images and MIPs were obtained and reviewed to evaluate the vascular anatomy. RADIATION DOSE REDUCTION: This exam was performed according to the departmental dose-optimization program which includes automated exposure control, adjustment of the mA and/or kV according to patient size and/or use of iterative reconstruction technique. CONTRAST:  OMNIPAQUE IOHEXOL 350 MG/ML SOLN COMPARISON:  None Available. FINDINGS: VASCULAR Normal  contour and caliber of the abdominal aorta. No evidence of aneurysm, dissection, or other acute aortic pathology. Standard branching pattern of the abdominal aorta, with solitary bilateral renal arteries. The branch vessel origins are patent, with specific attention to the superior mesenteric artery, which is widely patent through its origin and proximal order branch vessels. Review of the MIP images confirms the above findings. NON-VASCULAR Lower chest: No acute abnormality. Hepatobiliary: Hypoenhancing subcapsular lesion of the peripheral right lobe of the liver, hepatic segment VII/VIII, measuring 3.0 x 2.5 cm (series 11, image 17). No gallstones, gallbladder wall thickening, or biliary dilatation. Pancreas: Unremarkable. No pancreatic ductal dilatation or surrounding inflammatory changes. Spleen: Normal in size without significant abnormality. Adrenals/Urinary Tract: Adrenal glands are unremarkable. Kidneys are normal, without renal calculi, solid lesion, or hydronephrosis. Bladder is unremarkable. Stomach/Bowel: Stomach is within normal limits. Very abnormal appearance of the mid small bowel, with a long segment small bowel small bowel intussusception, and a heterogeneously hyperenhancing mass lesion about the intussuscepted lead segment in the central abdomen measuring 4.0 x 3.7 cm (series 14, image 29). Multiple air and fluid-filled, although not overtly distended loops of small bowel in proximity to this segment, however without overt bowel  obstruction. Lymphatic: No significant vascular findings are present. No enlarged abdominal or pelvic lymph nodes. Reproductive: No mass or other significant abnormality. Other: No abdominal wall hernia or abnormality. Small volume free fluid throughout the abdomen. Musculoskeletal: No acute or significant osseous findings. IMPRESSION: 1. Very abnormal appearance of the mid small bowel, with a long segment small bowel intussusception, and a heterogeneously hyperenhancing mass lesion about the intussuscepted lead segment in the central abdomen measuring 4.0 x 3.7 cm. Multiple air and fluid-filled, although not overtly distended loops of small bowel in proximity to this segment, however without overt bowel obstruction. 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 2. Hypoenhancing lesion of the peripheral right lobe of the liver, incompletely characterized but suspicious for a metastasis. Recommend multiphasic contrast enhanced MR to better characterize. 3. Small volume free fluid throughout the abdomen. 4. No evidence of aortic aneurysm, dissection, or other acute aortic pathology. The branch vessel origins are patent, with specific attention to the superior mesenteric artery, which is widely patent through its origin and proximal order branch vessels. No significant atherosclerosis. Electronically Signed   By: Jearld Lesch M.D.   On: 09/22/2022 20:43     A/P: 37 year old woman presents with unintentional weight loss and subsequent severe protein calorie malnutrition, severely underweight/cachectic, severe iron deficiency anemia, likely acute on chronic blood loss anemia secondary to GI mass and hematochezia, small bowel intussusception with associated mass and potential liver lesion, hypovolemic hyponatremia, moderate hypokalemia.  -I discussed the imaging findings and lab findings with the patient as well as with her significant other at the bedside.  My  recommendation is exploratory laparotomy with small bowel resection and possible liver biopsy depending on whether this lesion is accessible IntraOp.  We discussed what that would entail including risks of bleeding, infection, pain, scarring, injury to other intra-abdominal structures, need for additional bowel resection, possible ostomy, wound healing problems/dehiscence/hernia, ileus, obstruction, anastomotic leak/stricture/bleed, failure to resolve symptoms and ongoing failure to thrive, as well as cardiovascular/pulmonary/thromboembolic risks such as heart attack, arrhythmia, stroke, pneumonia, DVT/PE.  Questions were repeatedly welcomed and answered to their satisfaction.  -I recommend she receive transfusion to improve her hemoglobin at least above 7 before subjecting her to the risks of anesthesia and surgery.  It is possible  that she will also need transfusions after surgery depending on the clinical course.  There is not a clinically acceptable alternative.  She states she does not have a religious objection to receiving blood products, when I asked her directly.  Plainly discussed with them that it is unsafe for her to undergo this procedure before further resuscitation and transfusion, as this could lead to cardiovascular collapse and death.  Her risk of complications is already higher secondary to severe malnutrition.   -Patient states she would like to make a few phone calls before consenting to blood transfusion.  Surgery team will continue to be available and will follow for timing of possible surgery.    Patient Active Problem List   Diagnosis Date Noted   Intussusception of small bowel (HCC) 09/22/2022   Pregnancy 10/27/2013   SVD (spontaneous vaginal delivery) 10/27/2013   Heart palpitations 03/26/2011       Phylliss Blakes, MD [facility] Surgery  See AMION to contact appropriate on-call provider   [facility]

## 2022-09-23 NOTE — Progress Notes (Signed)
Patient signed consent for blood transfusion but she still going to wait until specialist see her. MD explained risk and benefits,

## 2022-09-23 NOTE — TOC Initial Note (Signed)
Transition of Care [facility]) - Initial/Assessment Note    Patient Details  Name: Meghan Holland MRN: [id number] Date of Birth: 20-Jun-1985  [facility]) CM/SW Contact:    Howell Rucks, RN Phone Number: 09/23/2022, 10:38 AM  Clinical Narrative:    Met with pt and female visitor at bedside to introduce role of TOC/NCM and review for dc planning.  Female visitor answered questions, confirmed pt has PCP and pharmacy in place, no DME or home care services, confirmed pt has transportation at discharge. Pt being followed by surgery. TOC will continue to follow.                    Patient Goals and CMS Choice            Expected Discharge Plan and Services                                              Prior Living Arrangements/Services                       Activities of Daily Living Home Assistive Devices/Equipment: None ADL Screening (condition at time of admission) Patient's cognitive ability adequate to safely complete daily activities?: Yes Is the patient deaf or have difficulty hearing?: No Does the patient have difficulty seeing, even when wearing glasses/contacts?: No Does the patient have difficulty concentrating, remembering, or making decisions?: No Patient able to express need for assistance with ADLs?: Yes Does the patient have difficulty dressing or bathing?: No Independently performs ADLs?: Yes (appropriate for developmental age) Does the patient have difficulty walking or climbing stairs?: No Weakness of Legs: None Weakness of Arms/Hands: None  Permission Sought/Granted                  Emotional Assessment              Admission diagnosis:  Intussusception (HCC) [K56.1] Hypokalemia [E87.6] Intussusception of small bowel (HCC) [K56.1] Anemia, unspecified type [D64.9] Abdominal mass, unspecified abdominal location [R19.00] Patient Active Problem List   Diagnosis Date Noted   Intussusception of small bowel (HCC)  09/22/2022   Pregnancy 10/27/2013   SVD (spontaneous vaginal delivery) 10/27/2013   Heart palpitations 03/26/2011   PCP:  Patient, No Pcp Per Pharmacy:   [facility] [address] [place] [address] Phone: [phone] Fax: [phone]     Social Determinants of Health (SDOH) Social History: SDOH Screenings   Food Insecurity: No Food Insecurity (09/22/2022)  Housing: Low Risk  (09/22/2022)  Transportation Needs: No Transportation Needs (09/22/2022)  Utilities: Not At Risk (09/22/2022)  Tobacco Use: Low Risk  (09/22/2022)   SDOH Interventions:     Readmission Risk Interventions     No data to display

## 2022-09-23 NOTE — Progress Notes (Signed)
The writer was informed by the nursing staff that the patient's significant other in the room has been secretly recording the patient's encounters.  No charge note.

## 2022-09-23 NOTE — Progress Notes (Signed)
[facility]  PROGRESS NOTE  Meghan Holland ZOX:[id number] DOB: 1985/10/10 DOA: 09/22/2022 PCP: Patient, No Pcp Per   Brief HPI:   37 year old female with medical history of iron deficiency anemia presented to [facility] ED with complaints of worsening abdominal cramping with radiation to her back.  Patient states that she had no appetite and lost about 40 pounds since August 2023.  Also had 3 episodes of bloody stools.  In the ED FOBT was positive, hemoglobin was low at 5.3.  CTA abdomen/pelvis showed small bowel intussusception and hyperenhancing mass lesion in the central abdomen measuring 4 x 3.7 cm.  Multiple air-fluid bubbles noted in small bowel, SBO.  Also found to have hypoenhancing lesion in the right lobe of liver, suspicious for metastasis. 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 -Plan for exploratory laparotomy in a.m. -Keep n.p.o., continue D5 LR at 100 mL/h  Severe iron deficiency anemia -Likely from chronic GI blood loss due to intra-abdominal mass as above -Ferritin 2, serum iron 5, saturation 1% -Hemoglobin is 5.6 today -2 units PRBC ordered, patient initially refused however after discussion with patient sister she agrees to get blood transfusion -Follow CBC in a.m.  Severe protein calorie malnutrition/BMI 15 kg/m -Highly suspicious for malignancy as above -Patient to undergo expiratory laparotomy  Hypokalemia -Resolved  Medications     sodium chloride   Intravenous Once     Data Reviewed:   CBG:  No results for input(s): "GLUCAP" in the last 168 hours.  SpO2: 100 %    Vitals:   09/23/22 1202 09/23/22 1232 09/23/22 1424 09/23/22 1500  BP: 109/74 103/68 108/64 108/71  Pulse: 85 96 86 87  Resp: 16 16 16 14   Temp: 98.3 F (36.8 C) 98 F (36.7 C) 98.2 F (36.8 C) 98.2 F (36.8 C)   TempSrc: Oral Oral Oral Oral  SpO2: 100% 100% 100% 100%  Weight:      Height:          Data Reviewed:  Basic Metabolic Panel: Recent Labs  Lab 09/22/22 1546 09/23/22 0020  NA 134* 133*  K 3.0* 3.6  CL 102 106  CO2 24 19*  GLUCOSE 114* 110*  BUN 9 6  CREATININE 0.54 0.52  CALCIUM 9.4 8.8*  MG  --  1.8  PHOS  --  3.5    CBC: Recent Labs  Lab 09/22/22 1546 09/23/22 0020  WBC 7.4 8.7  HGB 5.3* 5.6*  HCT 18.8* 20.9*  MCV 67.4* 69.2*  PLT 458* 509*    LFT Recent Labs  Lab 09/22/22 1546  AST 16  ALT 8  ALKPHOS 30*  BILITOT 0.3  PROT 7.4  ALBUMIN 4.0     Antibiotics: Anti-infectives (From admission, onward)    None        DVT prophylaxis: SCDs  Code Status: Full code  Family Communication: Discussed with patient's sister and significant other at bedside   CONSULTS General surgery   Subjective   Denies pain, anxious about surgery and blood transfusion.  Has been refusing blood transfusion   Objective    Physical Examination:   General-appears in no acute distress Heart-S1-S2, regular, no murmur auscultated Lungs-clear to auscultation bilaterally Abdomen-soft, nontender, no organomegaly Extremities-no edema in the lower extremities Neuro-alert, oriented x3, no focal deficit noted  Status is: Inpatient:  Meredeth Ide   [facility] If 7PM-7AM, please contact night-coverage at [email or web address], Office  [phone]   09/23/2022, 5:02 PM  LOS: 0 days

## 2022-09-23 NOTE — Progress Notes (Signed)
RN  noticed that the significant other is secretly recording conversation between the medical staff and patient.

## 2022-09-24 ENCOUNTER — Other Ambulatory Visit: Payer: Self-pay

## 2022-09-24 ENCOUNTER — Inpatient Hospital Stay (HOSPITAL_COMMUNITY): Payer: Medicaid Other | Admitting: Registered Nurse

## 2022-09-24 ENCOUNTER — Encounter (HOSPITAL_COMMUNITY): Payer: Self-pay | Admitting: Family Medicine

## 2022-09-24 ENCOUNTER — Encounter (HOSPITAL_COMMUNITY)
Admission: EM | Disposition: A | Discharge status: Home / Self Care | Payer: Self-pay | Source: Home / Self Care | Attending: Internal Medicine

## 2022-09-24 DIAGNOSIS — K56609 Unspecified intestinal obstruction, unspecified as to partial versus complete obstruction: Secondary | ICD-10-CM | POA: Diagnosis not present

## 2022-09-24 DIAGNOSIS — K561 Intussusception: Secondary | ICD-10-CM

## 2022-09-24 HISTORY — PX: LAPAROTOMY: SHX154

## 2022-09-24 LAB — CBC
HCT: 27.4 % — ABNORMAL LOW (ref 36.0–46.0)
HCT: 32.2 % — ABNORMAL LOW (ref 36.0–46.0)
Hemoglobin: 8.3 g/dL — ABNORMAL LOW (ref 12.0–15.0)
Hemoglobin: 9.5 g/dL — ABNORMAL LOW (ref 12.0–15.0)
MCH: 23.2 pg — ABNORMAL LOW (ref 26.0–34.0)
MCH: 22.7 pg — ABNORMAL LOW (ref 26.0–34.0)
MCHC: 30.3 g/dL (ref 30.0–36.0)
MCHC: 29.5 g/dL — ABNORMAL LOW (ref 30.0–36.0)
MCV: 76.5 fL — ABNORMAL LOW (ref 80.0–100.0)
MCV: 76.8 fL — ABNORMAL LOW (ref 80.0–100.0)
Platelets: 340 10*3/uL (ref 150–400)
Platelets: 346 10*3/uL (ref 150–400)
RBC: 3.58 MIL/uL — ABNORMAL LOW (ref 3.87–5.11)
RBC: 4.19 MIL/uL (ref 3.87–5.11)
RDW: 19.8 % — ABNORMAL HIGH (ref 11.5–15.5)
RDW: 20.1 % — ABNORMAL HIGH (ref 11.5–15.5)
WBC: 10.2 10*3/uL (ref 4.0–10.5)
WBC: 9.1 10*3/uL (ref 4.0–10.5)
nRBC: 0.2 % (ref 0.0–0.2)
nRBC: 0.3 % — ABNORMAL HIGH (ref 0.0–0.2)

## 2022-09-24 LAB — BASIC METABOLIC PANEL
Anion gap: 8 (ref 5–15)
BUN: 8 mg/dL (ref 6–20)
CO2: 20 mmol/L — ABNORMAL LOW (ref 22–32)
Calcium: 8.8 mg/dL — ABNORMAL LOW (ref 8.9–10.3)
Chloride: 108 mmol/L (ref 98–111)
Creatinine, Ser: 0.67 mg/dL (ref 0.44–1.00)
GFR, Estimated: >60 mL/min (ref >60)
Glucose, Bld: 93 mg/dL (ref 70–99)
Potassium: 3.4 mmol/L — ABNORMAL LOW (ref 3.5–5.1)
Sodium: 136 mmol/L (ref 135–145)

## 2022-09-24 LAB — SURGICAL PCR SCREEN
MRSA, PCR: NEGATIVE
Staphylococcus aureus: POSITIVE — AB

## 2022-09-24 LAB — HEMOGLOBIN AND HEMATOCRIT, BLOOD
HCT: 25 % — ABNORMAL LOW (ref 36.0–46.0)
Hemoglobin: 7.6 g/dL — ABNORMAL LOW (ref 12.0–15.0)

## 2022-09-24 LAB — PREPARE RBC (CROSSMATCH)

## 2022-09-24 LAB — POCT I-STAT, CHEM 8
BUN: 5 mg/dL — ABNORMAL LOW (ref 6–20)
Calcium, Ion: 1.3 mmol/L (ref 1.15–1.40)
Chloride: 107 mmol/L (ref 98–111)
Creatinine, Ser: 0.6 mg/dL (ref 0.44–1.00)
Glucose, Bld: 80 mg/dL (ref 70–99)
HCT: 33 % — ABNORMAL LOW (ref 36.0–46.0)
Hemoglobin: 11.2 g/dL — ABNORMAL LOW (ref 12.0–15.0)
Potassium: 3.8 mmol/L (ref 3.5–5.1)
Sodium: 138 mmol/L (ref 135–145)
TCO2: 20 mmol/L — ABNORMAL LOW (ref 22–32)

## 2022-09-24 SURGERY — LAPAROTOMY, EXPLORATORY
Anesthesia: General | Site: Abdomen

## 2022-09-24 MED ORDER — PROPOFOL 10 MG/ML IV BOLUS
INTRAVENOUS | Status: AC
  Filled 2022-09-24: qty 20

## 2022-09-24 MED ORDER — OXYCODONE HCL 5 MG PO TABS
5.0000 mg | ORAL_TABLET | Freq: Four times a day (QID) | ORAL | Status: DC | PRN
  Administered 2022-09-24 – 2022-09-29 (×7): 5 mg via ORAL
  Filled 2022-09-24: qty 1
  Filled 2022-09-24: qty 2
  Filled 2022-09-24 (×2): qty 1
  Filled 2022-09-24: qty 2
  Filled 2022-09-24 (×2): qty 1
  Filled 2022-09-24: qty 2
  Filled 2022-09-24: qty 1
  Filled 2022-09-24: qty 2

## 2022-09-24 MED ORDER — SODIUM CHLORIDE 0.9 % IV SOLN: INTRAVENOUS | Status: DC | PRN

## 2022-09-24 MED ORDER — LIDOCAINE HCL (PF) 2 % IJ SOLN
INTRAMUSCULAR | Status: AC
  Filled 2022-09-24: qty 5

## 2022-09-24 MED ORDER — HYDROMORPHONE HCL 1 MG/ML IJ SOLN
0.5000 mg | INTRAMUSCULAR | Status: DC | PRN
  Administered 2022-09-24: 0.5 mg via INTRAVENOUS
  Administered 2022-09-25 – 2022-09-27 (×3): 1 mg via INTRAVENOUS
  Filled 2022-09-24 (×6): qty 1

## 2022-09-24 MED ORDER — KETAMINE HCL 10 MG/ML IJ SOLN
INTRAMUSCULAR | Status: DC | PRN
  Administered 2022-09-24: 20 mg via INTRAVENOUS
  Administered 2022-09-24: 10 mg via INTRAVENOUS

## 2022-09-24 MED ORDER — LACTATED RINGERS IV SOLN: INTRAVENOUS | Status: DC | PRN

## 2022-09-24 MED ORDER — SUCCINYLCHOLINE CHLORIDE 200 MG/10ML IV SOSY
PREFILLED_SYRINGE | INTRAVENOUS | Status: AC
  Filled 2022-09-24: qty 10

## 2022-09-24 MED ORDER — DEXAMETHASONE SODIUM PHOSPHATE 10 MG/ML IJ SOLN
INTRAMUSCULAR | Status: AC
  Filled 2022-09-24: qty 1

## 2022-09-24 MED ORDER — ROCURONIUM BROMIDE 10 MG/ML (PF) SYRINGE
PREFILLED_SYRINGE | INTRAVENOUS | Status: DC | PRN
  Administered 2022-09-24: 50 mg via INTRAVENOUS
  Administered 2022-09-24: 10 mg via INTRAVENOUS
  Administered 2022-09-24: 20 mg via INTRAVENOUS

## 2022-09-24 MED ORDER — ACETAMINOPHEN 10 MG/ML IV SOLN
INTRAVENOUS | Status: DC | PRN
  Administered 2022-09-24: 1000 mg via INTRAVENOUS

## 2022-09-24 MED ORDER — ONDANSETRON HCL 4 MG/2ML IJ SOLN
INTRAMUSCULAR | Status: AC
  Filled 2022-09-24: qty 2

## 2022-09-24 MED ORDER — HYDROMORPHONE HCL 1 MG/ML IJ SOLN
INTRAMUSCULAR | Status: AC
  Administered 2022-09-24: 0.5 mg via INTRAVENOUS
  Filled 2022-09-24: qty 1

## 2022-09-24 MED ORDER — MIDAZOLAM HCL 5 MG/5ML IJ SOLN
INTRAMUSCULAR | Status: DC | PRN
  Administered 2022-09-24: 2 mg via INTRAVENOUS

## 2022-09-24 MED ORDER — 0.9 % SODIUM CHLORIDE (POUR BTL) OPTIME
TOPICAL | Status: DC | PRN
  Administered 2022-09-24: 2000 mL

## 2022-09-24 MED ORDER — ALBUMIN HUMAN 5 % IV SOLN: INTRAVENOUS | Status: DC | PRN

## 2022-09-24 MED ORDER — LACTATED RINGERS IV SOLN: INTRAVENOUS | Status: DC

## 2022-09-24 MED ORDER — ORAL CARE MOUTH RINSE: 15.0000 mL | Freq: Once | OROMUCOSAL | Status: AC

## 2022-09-24 MED ORDER — MIDAZOLAM HCL 2 MG/2ML IJ SOLN
INTRAMUSCULAR | Status: AC
  Filled 2022-09-24: qty 2

## 2022-09-24 MED ORDER — FENTANYL CITRATE (PF) 100 MCG/2ML IJ SOLN
INTRAMUSCULAR | Status: AC
  Filled 2022-09-24: qty 2

## 2022-09-24 MED ORDER — CEFAZOLIN SODIUM-DEXTROSE 2-4 GM/100ML-% IV SOLN
2.0000 g | INTRAVENOUS | Status: DC
  Filled 2022-09-24: qty 100

## 2022-09-24 MED ORDER — ROCURONIUM BROMIDE 10 MG/ML (PF) SYRINGE
PREFILLED_SYRINGE | INTRAVENOUS | Status: AC
  Filled 2022-09-24: qty 10

## 2022-09-24 MED ORDER — SUCCINYLCHOLINE CHLORIDE 200 MG/10ML IV SOSY
PREFILLED_SYRINGE | INTRAVENOUS | Status: DC | PRN
  Administered 2022-09-24: 100 mg via INTRAVENOUS

## 2022-09-24 MED ORDER — PROPOFOL 10 MG/ML IV BOLUS
INTRAVENOUS | Status: DC | PRN
  Administered 2022-09-24: 100 mg via INTRAVENOUS

## 2022-09-24 MED ORDER — FENTANYL CITRATE (PF) 100 MCG/2ML IJ SOLN
INTRAMUSCULAR | Status: DC | PRN
  Administered 2022-09-24 (×4): 50 ug via INTRAVENOUS

## 2022-09-24 MED ORDER — LIDOCAINE 2% (20 MG/ML) 5 ML SYRINGE
INTRAMUSCULAR | Status: DC | PRN
  Administered 2022-09-24: 40 mg via INTRAVENOUS

## 2022-09-24 MED ORDER — CHLORHEXIDINE GLUCONATE 0.12 % MT SOLN
15.0000 mL | Freq: Once | OROMUCOSAL | Status: AC
  Administered 2022-09-24: 15 mL via OROMUCOSAL

## 2022-09-24 MED ORDER — ACETAMINOPHEN 10 MG/ML IV SOLN
INTRAVENOUS | Status: AC
  Filled 2022-09-24: qty 100

## 2022-09-24 MED ORDER — HYDROMORPHONE HCL 1 MG/ML IJ SOLN
0.2500 mg | Freq: Once | INTRAMUSCULAR | Status: AC | PRN
  Administered 2022-09-24: 0.25 mg via INTRAVENOUS
  Filled 2022-09-24: qty 0.5

## 2022-09-24 MED ORDER — DEXAMETHASONE SODIUM PHOSPHATE 10 MG/ML IJ SOLN
INTRAMUSCULAR | Status: DC | PRN
  Administered 2022-09-24: 8 mg via INTRAVENOUS

## 2022-09-24 MED ORDER — KETAMINE HCL 50 MG/5ML IJ SOSY
PREFILLED_SYRINGE | INTRAMUSCULAR | Status: AC
  Filled 2022-09-24: qty 5

## 2022-09-24 MED ORDER — ALBUMIN HUMAN 5 % IV SOLN
INTRAVENOUS | Status: AC
  Filled 2022-09-24: qty 250

## 2022-09-24 MED ORDER — POTASSIUM CHLORIDE 10 MEQ/100ML IV SOLN
10.0000 meq | INTRAVENOUS | Status: AC
  Administered 2022-09-24 (×2): 10 meq via INTRAVENOUS
  Filled 2022-09-24 (×2): qty 100

## 2022-09-24 MED ORDER — SUGAMMADEX SODIUM 200 MG/2ML IV SOLN
INTRAVENOUS | Status: DC | PRN
  Administered 2022-09-24: 200 mg via INTRAVENOUS

## 2022-09-24 MED ORDER — HYDROMORPHONE HCL 1 MG/ML IJ SOLN
0.2500 mg | INTRAMUSCULAR | Status: DC | PRN
  Administered 2022-09-24: 0.5 mg via INTRAVENOUS

## 2022-09-24 MED ORDER — METHOCARBAMOL 1000 MG/10ML IJ SOLN
500.0000 mg | Freq: Three times a day (TID) | INTRAVENOUS | Status: DC
  Administered 2022-09-25 – 2022-10-01 (×20): 500 mg via INTRAVENOUS
  Filled 2022-09-24: qty 5
  Filled 2022-09-24 (×2): qty 500
  Filled 2022-09-24 (×2): qty 5
  Filled 2022-09-24 (×5): qty 500
  Filled 2022-09-24: qty 5
  Filled 2022-09-24 (×12): qty 500

## 2022-09-24 MED ORDER — MUPIROCIN 2 % EX OINT
1.0000 | TOPICAL_OINTMENT | Freq: Two times a day (BID) | CUTANEOUS | Status: AC
  Administered 2022-09-24 – 2022-09-28 (×10): 1 via NASAL
  Filled 2022-09-24 (×3): qty 22

## 2022-09-24 MED ORDER — ONDANSETRON HCL 4 MG/2ML IJ SOLN
INTRAMUSCULAR | Status: DC | PRN
  Administered 2022-09-24: 4 mg via INTRAVENOUS

## 2022-09-24 SURGICAL SUPPLY — 45 items
APL PRP STRL LF DISP 70% ISPRP (MISCELLANEOUS)
BAG COUNTER SPONGE SURGICOUNT (BAG) IMPLANT
BAG SPNG CNTER NS LX DISP (BAG) ×1
CHLORAPREP W/TINT 26 (MISCELLANEOUS) IMPLANT
COVER MAYO STAND STRL (DRAPES) ×1 IMPLANT
COVER SURGICAL LIGHT HANDLE (MISCELLANEOUS) ×1 IMPLANT
DRAIN CHANNEL 19F RND (DRAIN) IMPLANT
DRAPE LAPAROSCOPIC ABDOMINAL (DRAPES) ×1 IMPLANT
DRAPE WARM FLUID 44X44 (DRAPES) IMPLANT
DRSG OPSITE POSTOP 4X10 (GAUZE/BANDAGES/DRESSINGS) IMPLANT
DRSG OPSITE POSTOP 4X6 (GAUZE/BANDAGES/DRESSINGS) IMPLANT
DRSG OPSITE POSTOP 4X8 (GAUZE/BANDAGES/DRESSINGS) IMPLANT
ELECT PENCIL ROCKER SW 15FT (MISCELLANEOUS) IMPLANT
ELECT REM PT RETURN 15FT ADLT (MISCELLANEOUS) ×1 IMPLANT
EVACUATOR SILICONE 100CC (DRAIN) IMPLANT
GLOVE BIO SURGEON STRL SZ 6 (GLOVE) ×2 IMPLANT
GLOVE INDICATOR 6.5 STRL GRN (GLOVE) ×2 IMPLANT
GLOVE SS BIOGEL STRL SZ 6 (GLOVE) ×1 IMPLANT
GOWN STRL REUS W/ TWL LRG LVL3 (GOWN DISPOSABLE) ×1 IMPLANT
GOWN STRL REUS W/ TWL XL LVL3 (GOWN DISPOSABLE) IMPLANT
GOWN STRL REUS W/TWL LRG LVL3 (GOWN DISPOSABLE) ×1
GOWN STRL REUS W/TWL XL LVL3 (GOWN DISPOSABLE)
KIT TURNOVER KIT A (KITS) IMPLANT
LIGASURE IMPACT 36 18CM CVD LR (INSTRUMENTS) IMPLANT
PACK GENERAL/GYN (CUSTOM PROCEDURE TRAY) ×1 IMPLANT
RELOAD PROXIMATE 75MM BLUE (ENDOMECHANICALS) ×2 IMPLANT
RELOAD STAPLE 75 3.8 BLU REG (ENDOMECHANICALS) IMPLANT
SPONGE T-LAP 18X18 ~~LOC~~+RFID (SPONGE) IMPLANT
STAPLER GUN LINEAR PROX 60 (STAPLE) IMPLANT
STAPLER PROXIMATE 75MM BLUE (STAPLE) IMPLANT
STAPLER VISISTAT 35W (STAPLE) IMPLANT
SUT ETHILON 3 0 PS 1 (SUTURE) IMPLANT
SUT MNCRL AB 4-0 PS2 18 (SUTURE) IMPLANT
SUT NOVA T20/GS 25 (SUTURE) IMPLANT
SUT SILK 2 0 (SUTURE) ×2
SUT SILK 2 0 SH CR/8 (SUTURE) ×1 IMPLANT
SUT SILK 2-0 18XBRD TIE 12 (SUTURE) ×1 IMPLANT
SUT SILK 3 0 (SUTURE) ×2
SUT SILK 3 0 SH CR/8 (SUTURE) ×1 IMPLANT
SUT SILK 3-0 18XBRD TIE 12 (SUTURE) ×1 IMPLANT
TOWEL OR 17X26 10 PK STRL BLUE (TOWEL DISPOSABLE) IMPLANT
TOWEL OR NON WOVEN STRL DISP B (DISPOSABLE) ×1 IMPLANT
TRAY FOLEY MTR SLVR 14FR STAT (SET/KITS/TRAYS/PACK) ×1 IMPLANT
TRAY FOLEY MTR SLVR 16FR STAT (SET/KITS/TRAYS/PACK) ×1 IMPLANT
YANKAUER SUCT BULB TIP 10FT TU (MISCELLANEOUS) IMPLANT

## 2022-09-24 NOTE — Transfer of Care (Signed)
Immediate Anesthesia Transfer of Care Note  Patient: Meghan Holland  Procedure(s) Performed: EXPLORATORY LAPAROTOMY WITH RIGHT HEMICOLECTOMY (Abdomen)  Patient Location: PACU  Anesthesia Type:General  Level of Consciousness: awake, alert , oriented, and patient cooperative  Airway & Oxygen Therapy: Patient Spontanous Breathing and Patient connected to face mask oxygen  Post-op Assessment: Report given to RN, Post -op Vital signs reviewed and stable, and Patient moving all extremities  Post vital signs: Reviewed and stable  Last Vitals:  Vitals Value Taken Time  BP 118/84 09/24/22 1408  Temp    Pulse 64 09/24/22 1412  Resp 15 09/24/22 1412  SpO2 100 % 09/24/22 1412  Vitals shown include unvalidated device data.  Last Pain:  Vitals:   09/24/22 1101  TempSrc: Oral  PainSc:       Patients Stated Pain Goal: 4 (09/24/22 1000)  Complications: No notable events documented.

## 2022-09-24 NOTE — Op Note (Signed)
Operative Note  GITEL NISSLEY  [id number]  [id number]  09/24/2022   Surgeon: Phylliss Blakes MD FACS   Assistant: Barnetta Chapel PA-C   Procedure performed: Exploratory laparotomy, right colectomy   Preop diagnosis: Small bowel intussusception with associated mass partial obstruction and GI bleed, liver mass Post-op diagnosis/intraop findings: ileocolonic intussusception with mass; liver lesion ~segment 7   Specimens: right hemicolectomy Retained items: no  EBL: 30cc Complications: none   Description of procedure: After confirming informed consent the patient was taken to the operating room and placed supine on the operating room table where general endotracheal anesthesia was initiated, preoperative antibiotics were administered, SCDs applied, and a formal timeout was performed.  Foley catheter was placed under sterile conditions.  The abdomen was prepped and draped in usual sterile fashion and a midline laparotomy was created.  The wound protector was placed.  The small bowel was noted to be dilated along the distal segments.  This was eviscerated and she was noted to have an ileocolonic intussusception with an associated mass.  This was only partially reducible.  There was additionally an approximately 2 cm mass along the ileocolic pedicle and the mesentery which was fairly soft.  The liver lesion noted on CT was able to be palpated, this was high up on the right lateral aspect of the liver and not visualizable for safe biopsy.  Will be proceeded to mobilize the terminal ileum and ascending colon taking thin transparent strands of tissue.  The retroperitoneum was not entered.  The duodenum was well away from our dissection and was protected.  We mobilized the omentum away from the transverse colon slightly.  Blue load 75 mm Endo GIA staplers were then used to transect the terminal ileum and the transverse colon just lateral to the easily palpable middle colic vessels.  The intervening mesentery  was divided with the LigaSure until we reached the ileocolic pedicle; a Kelly clamp was placed across this and then this was divided.  The pedicle was ligated with 2-0 silk suture ligature and an additional 3-0 silk tie.  The specimen was handed off.  The abdominal cavity was irrigated and inspected; hemostasis was confirmed.  There was no other peritoneal or omental lesion of note.  The small bowel was aligned with the transverse colon; enterotomies were made and an anastomosis created with a 75 mm blue load Endo GIA stapler.  Inspection of the lumen appeared hemostatic along the luminal aspect of the staple line.  The common enterotomy was closed with a TX 60 blue load stapler.  3-0 silk sutures were placed at the apex of the staple line to avoid tension here; the corners of the TX staple line were imbricated with seromuscular 3-0 silks.  Oozing points along the staple line were addressed with 3-0 silk figure-of-eight sutures.  On completion the anastomosis appears well-perfused, tension-free, and is widely patent.  We noted that the proximal small bowel was readily herniating through the mesenteric defect; therefore this was closed with interrupted figure-of-eight silk sutures.  The small bowel and remainder of the colon were then inspected; the small bowel was run from ligament of Treitz all the way to the anastomosis and this appeared to be without additional abnormality, well-perfused, and with no twisting or abnormality of the mesentery.  The abdomen was irrigated once more and the effluent was clear.  Omentum was brought over the anastomosis.  The wound protector was removed and at this time clean/dirty protocol was followed with exchange of all gowns, gloves,  instruments and drapes for new ones.  The fascia was closed with a running #1 single strand PDS starting at either end and tying centrally.  The skin was closed with running subcuticular 4-0 Monocryl followed by honeycomb dressing.  The patient was  then awakened, extubated and taken to PACU in stable condition.    All counts were correct at the completion of the case.

## 2022-09-24 NOTE — Anesthesia Procedure Notes (Signed)
Procedure Name: Intubation Date/Time: 09/24/2022 11:56 AM  Performed by: Elisabeth Cara, CRNAPre-anesthesia Checklist: Patient identified, Emergency Drugs available, Suction available, Patient being monitored and Timeout performed Patient Re-evaluated:Patient Re-evaluated prior to induction Oxygen Delivery Method: Circle system utilized Preoxygenation: Pre-oxygenation with 100% oxygen Induction Type: IV induction, Rapid sequence and Cricoid Pressure applied Laryngoscope Size: Mac and 4 Grade View: Grade I Tube type: Oral Tube size: 7.5 mm Number of attempts: 1 Airway Equipment and Method: Stylet Placement Confirmation: ETT inserted through vocal cords under direct vision, positive ETCO2 and breath sounds checked- equal and bilateral Secured at: 21 cm Tube secured with: Tape Dental Injury: Teeth and Oropharynx as per pre-operative assessment  Comments: RSI with cricoid pressure by Dr Armond Hang. Grade 1 view. ATOI

## 2022-09-24 NOTE — Anesthesia Preprocedure Evaluation (Addendum)
Anesthesia Evaluation  Patient identified by MRN, date of birth, ID band Patient awake    Reviewed: Allergy & Precautions, NPO status , Patient's Chart, lab work & pertinent test results  Airway Mallampati: I  TM Distance: >3 FB Neck ROM: Full    Dental no notable dental hx. (+) Teeth Intact, Dental Advisory Given   Pulmonary neg pulmonary ROS   Pulmonary exam normal breath sounds clear to auscultation       Cardiovascular negative cardio ROS Normal cardiovascular exam Rhythm:Regular Rate:Normal     Neuro/Psych negative neurological ROS  negative psych ROS   GI/Hepatic negative GI ROS, Neg liver ROS,,,  Endo/Other  negative endocrine ROS    Renal/GU negative Renal ROS  negative genitourinary   Musculoskeletal negative musculoskeletal ROS (+)    Abdominal   Peds  Hematology  (+) Blood dyscrasia, anemia Lab Results      Component                Value               Date                      WBC                      10.2                09/24/2022                HGB                      7.6 (L)             09/24/2022                HCT                      25.0 (L)            09/24/2022                MCV                      76.5 (L)            09/24/2022                PLT                      340                 09/24/2022              Anesthesia Other Findings 37 year old female with medical history of iron deficiency anemia presented with abdominal pain, weight loss, and bloody stool found to have Hgb 5.3, bowel intussusception, SBO, bowel mass, and possible liver mets.   Reproductive/Obstetrics                             Anesthesia Physical Anesthesia Plan  ASA: 2  Anesthesia Plan: General   Post-op Pain Management: Ketamine IV*   Induction: Intravenous and Rapid sequence  PONV Risk Score and Plan: 3 and Midazolam, Dexamethasone and Ondansetron  Airway Management Planned: Oral  ETT  Additional Equipment:   Intra-op Plan:   Post-operative Plan: Extubation in OR  Informed Consent: I have reviewed the patients  History and Physical, chart, labs and discussed the procedure including the risks, benefits and alternatives for the proposed anesthesia with the patient or authorized representative who has indicated his/her understanding and acceptance.     Dental advisory given  Plan Discussed with: CRNA  Anesthesia Plan Comments:        Anesthesia Quick Evaluation

## 2022-09-24 NOTE — Progress Notes (Signed)
Patient returned from surgery. A&O, VSS. Honeycomb dressing intact to mid abdomen with small amounts of bright red blood present. Oncoming RN aware and assessed also. FC intact and draining with no complications. Husband and sister at bedside. Patient resting comfortably in bed at this time.

## 2022-09-24 NOTE — Progress Notes (Signed)
[facility]  PROGRESS NOTE  Meghan Holland:[id number] DOB: 07-Apr-1985 DOA: 09/22/2022 PCP: Patient, No Pcp Per   Brief HPI:   37 year old female with medical history of iron deficiency anemia presented to [facility] ED with complaints of worsening abdominal cramping with radiation to her back.  Patient states that she had no appetite and lost about 40 pounds since August 2023.  Also had 3 episodes of bloody stools.  In the ED FOBT was positive, hemoglobin was low at 5.3.  CTA abdomen/pelvis showed small bowel intussusception and hyperenhancing mass lesion in the central abdomen measuring 4 x 3.7 cm.  Multiple air-fluid bubbles noted in small bowel, SBO.  Also found to have hypoenhancing lesion in the right lobe of liver, suspicious for metastasis. 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 -Plan for exploratory laparotomy in a.m. -Keep n.p.o., continue D5 LR at 100 mL/h  Severe iron deficiency anemia -Likely from chronic GI blood loss due to intra-abdominal mass as above -Ferritin 2, serum iron 5, saturation 1% -Hemoglobin is 5.6 today -2 units PRBC ordered, patient initially refused however after discussion with patient sister she agrees to get blood transfusion -Follow CBC in a.m.  Severe protein calorie malnutrition/BMI 15 kg/m -Highly suspicious for malignancy as above -Patient to undergo expiratory laparotomy  Hypokalemia -Resolved  Medications     sodium chloride   Intravenous Once   mupirocin ointment  1 Application Nasal BID     Data Reviewed:   CBG:  No results for input(s): "GLUCAP" in the last 168 hours.  SpO2: 100 %    Vitals:   09/24/22 0007 09/24/22 0007 09/24/22 0300 09/24/22 0310  BP: 107/65 107/65 102/62 102/62  Pulse: 72 72 73 73  Resp: 18 18 16 16   Temp: 98.3 F (36.8 C) 98.3 F  (36.8 C) 98.2 F (36.8 C) 98.2 F (36.8 C)  TempSrc:  Oral Oral Oral  SpO2:  100%  100%  Weight:      Height:          Data Reviewed:  Basic Metabolic Panel: Recent Labs  Lab 09/22/22 1546 09/23/22 0020 09/24/22 0528  NA 134* 133* 136  K 3.0* 3.6 3.4*  CL 102 106 108  CO2 24 19* 20*  GLUCOSE 114* 110* 93  BUN 9 6 8   CREATININE 0.54 0.52 0.67  CALCIUM 9.4 8.8* 8.8*  MG  --  1.8  --   PHOS  --  3.5  --     CBC: Recent Labs  Lab 09/22/22 1546 09/23/22 0020 09/23/22 2128 09/24/22 0528 09/24/22 0719  WBC 7.4 8.7  --  10.2  --   HGB 5.3* 5.6* 6.6* 8.3* 7.6*  HCT 18.8* 20.9* 22.0* 27.4* 25.0*  MCV 67.4* 69.2*  --  76.5*  --   PLT 458* 509*  --  340  --     LFT Recent Labs  Lab 09/22/22 1546  AST 16  ALT 8  ALKPHOS 30*  BILITOT 0.3  PROT 7.4  ALBUMIN 4.0     Antibiotics: Anti-infectives (From admission, onward)    Start     Dose/Rate Route Frequency Ordered Stop   09/24/22 0800  ceFAZolin (ANCEF) IVPB 2g/100 mL premix        2 g 200 mL/hr over 30 Minutes Intravenous On call to O.R. 09/24/22 4401 09/25/22 0559  DVT prophylaxis: SCDs  Code Status: Full code  Family Communication: Discussed with patient's sister and significant other at bedside   CONSULTS General surgery   Subjective   Received 2 units PRBC last night, hemoglobin up to 7.6 this morning.  General surgery plans  laparotomy for intussusception and small bowel mass.   Objective    Physical Examination:  Appears in no acute distress S1-S2, regular Lungs clear to auscultation bilaterally Abdomen is soft, nontender, no organomegaly   Status is: Inpatient:             Meredeth Ide   [facility] If 7PM-7AM, please contact night-coverage at [email or web address], Office  [phone]   09/24/2022, 9:30 AM  LOS: 1 day

## 2022-09-24 NOTE — Progress Notes (Addendum)
Day of Surgery   Subjective/Chief Complaint: Uneventful night.  Ongoing pain, 3 maroon-colored stools   Objective: Vital signs in last 24 hours: Temp:  [97.8 F (36.6 C)-98.8 F (37.1 C)] 98.2 F (36.8 C) (07/09 0310) Pulse Rate:  [72-96] 73 (07/09 0310) Resp:  [14-18] 16 (07/09 0310) BP: (101-111)/(62-77) 102/62 (07/09 0310) SpO2:  [100 %] 100 % (07/09 0310) Last BM Date : 09/23/22  Intake/Output from previous day: 07/08 0701 - 07/09 0700 In: 1690.8 [I.V.:542.7; Blood:1148.2] Out: -  Intake/Output this shift: No intake/output data recorded.  Cachectic, no acute distress Unlabored respirations  Lab Results:  Recent Labs    09/23/22 0020 09/23/22 2128 09/24/22 0528 09/24/22 0719  WBC 8.7  --  10.2  --   HGB 5.6*   < > 8.3* 7.6*  HCT 20.9*   < > 27.4* 25.0*  PLT 509*  --  340  --    < > = values in this interval not displayed.   BMET Recent Labs    09/23/22 0020 09/24/22 0528  NA 133* 136  K 3.6 3.4*  CL 106 108  CO2 19* 20*  GLUCOSE 110* 93  BUN 6 8  CREATININE 0.52 0.67  CALCIUM 8.8* 8.8*   PT/INR No results for input(s): "LABPROT", "INR" in the last 72 hours. ABG No results for input(s): "PHART", "HCO3" in the last 72 hours.  Invalid input(s): "PCO2", "PO2"  Studies/Results: CT ANGIO GI BLEED  Result Date: 09/22/2022 CLINICAL DATA:  Mesenteric ischemia, abdominal pain radiating to back, nausea, vomiting, diarrhea * Tracking Code: BO * EXAM: CTA ABDOMEN AND PELVIS WITHOUT AND WITH CONTRAST TECHNIQUE: Multidetector CT imaging of the abdomen and pelvis was performed using the standard protocol during bolus administration of intravenous contrast. Multiplanar reconstructed images and MIPs were obtained and reviewed to evaluate the vascular anatomy. RADIATION DOSE REDUCTION: This exam was performed according to the departmental dose-optimization program which includes automated exposure control, adjustment of the mA and/or kV according to patient size and/or  use of iterative reconstruction technique. CONTRAST:  OMNIPAQUE IOHEXOL 350 MG/ML SOLN COMPARISON:  None Available. FINDINGS: VASCULAR Normal contour and caliber of the abdominal aorta. No evidence of aneurysm, dissection, or other acute aortic pathology. Standard branching pattern of the abdominal aorta, with solitary bilateral renal arteries. The branch vessel origins are patent, with specific attention to the superior mesenteric artery, which is widely patent through its origin and proximal order branch vessels. Review of the MIP images confirms the above findings. NON-VASCULAR Lower chest: No acute abnormality. Hepatobiliary: Hypoenhancing subcapsular lesion of the peripheral right lobe of the liver, hepatic segment VII/VIII, measuring 3.0 x 2.5 cm (series 11, image 17). No gallstones, gallbladder wall thickening, or biliary dilatation. Pancreas: Unremarkable. No pancreatic ductal dilatation or surrounding inflammatory changes. Spleen: Normal in size without significant abnormality. Adrenals/Urinary Tract: Adrenal glands are unremarkable. Kidneys are normal, without renal calculi, solid lesion, or hydronephrosis. Bladder is unremarkable. Stomach/Bowel: Stomach is within normal limits. Very abnormal appearance of the mid small bowel, with a long segment small bowel small bowel intussusception, and a heterogeneously hyperenhancing mass lesion about the intussuscepted lead segment in the central abdomen measuring 4.0 x 3.7 cm (series 14, image 29). Multiple air and fluid-filled, although not overtly distended loops of small bowel in proximity to this segment, however without overt bowel obstruction. Lymphatic: No significant vascular findings are present. No enlarged abdominal or pelvic lymph nodes. Reproductive: No mass or other significant abnormality. Other: No abdominal wall hernia or abnormality. Small  volume free fluid throughout the abdomen. Musculoskeletal: No acute or significant osseous findings.  IMPRESSION: 1. Very abnormal appearance of the mid small bowel, with a long segment small bowel intussusception, and a heterogeneously hyperenhancing mass lesion about the intussuscepted lead segment in the central abdomen measuring 4.0 x 3.7 cm. Multiple air and fluid-filled, although not overtly distended loops of small bowel in proximity to this segment, however without overt bowel obstruction. 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 2. Hypoenhancing lesion of the peripheral right lobe of the liver, incompletely characterized but suspicious for a metastasis. Recommend multiphasic contrast enhanced MR to better characterize. 3. Small volume free fluid throughout the abdomen. 4. No evidence of aortic aneurysm, dissection, or other acute aortic pathology. The branch vessel origins are patent, with specific attention to the superior mesenteric artery, which is widely patent through its origin and proximal order branch vessels. No significant atherosclerosis. Electronically Signed   By: Jearld Lesch M.D.   On: 09/22/2022 20:43    Anti-infectives: Anti-infectives (From admission, onward)    Start     Dose/Rate Route Frequency Ordered Stop   09/24/22 0800  ceFAZolin (ANCEF) IVPB 2g/100 mL premix        2 g 200 mL/hr over 30 Minutes Intravenous On call to O.R. 09/24/22 0712 09/25/22 0559       Assessment/Plan:  Small bowel mass with intussusception Liver lesion concerning for metastasis Iron deficiency anemia-hemoglobin acceptably improved status post 2 units PRBC Severe protein calorie malnutrition Hypokalemia 3.4  Plan OR today for exploratory laparotomy, small bowel resection, possible liver biopsy.  I went over the plan with the patient and her spouse at the bedside again and again we discussed risks of bleeding, infection, pain, scarring, injury to other intra-abdominal structures or need for additional bowel resection, possible  ostomy, wound healing problems/dehiscence/hernia, ileus, obstruction, anastomotic leak/bleed/stricture, ongoing failure to thrive or failure to resolve symptoms, as well as cardiovascular/pulmonary/thromboembolic risks.  Discussed anticipated postoperative recovery and timeline and that this is variable from patient to patient.  Questions were welcomed and answered to their satisfaction.   LOS: 1 day    Berna Bue 09/24/2022

## 2022-09-24 NOTE — Progress Notes (Signed)
Consent form filled out by RN and is in patient's room- pt requesting to speak with surgeon for more questions before signing. Per patient, she had 3 bloody BMs last night. None yet this AM. Will continue to monitor.

## 2022-09-25 ENCOUNTER — Other Ambulatory Visit: Payer: Self-pay

## 2022-09-25 ENCOUNTER — Encounter (HOSPITAL_COMMUNITY): Payer: Self-pay | Admitting: Surgery

## 2022-09-25 DIAGNOSIS — E43 Unspecified severe protein-calorie malnutrition: Secondary | ICD-10-CM | POA: Insufficient documentation

## 2022-09-25 DIAGNOSIS — K561 Intussusception: Secondary | ICD-10-CM | POA: Diagnosis not present

## 2022-09-25 LAB — CBC
HCT: 24.5 % — ABNORMAL LOW (ref 36.0–46.0)
Hemoglobin: 7.4 g/dL — ABNORMAL LOW (ref 12.0–15.0)
MCH: 23 pg — ABNORMAL LOW (ref 26.0–34.0)
MCHC: 30.2 g/dL (ref 30.0–36.0)
MCV: 76.1 fL — ABNORMAL LOW (ref 80.0–100.0)
Platelets: 271 10*3/uL (ref 150–400)
RBC: 3.22 MIL/uL — ABNORMAL LOW (ref 3.87–5.11)
RDW: 20.6 % — ABNORMAL HIGH (ref 11.5–15.5)
WBC: 8.4 10*3/uL (ref 4.0–10.5)
nRBC: 0.7 % — ABNORMAL HIGH (ref 0.0–0.2)

## 2022-09-25 LAB — BASIC METABOLIC PANEL
Anion gap: 6 (ref 5–15)
BUN: <5 mg/dL — ABNORMAL LOW (ref 6–20)
CO2: 22 mmol/L (ref 22–32)
Calcium: 8.2 mg/dL — ABNORMAL LOW (ref 8.9–10.3)
Chloride: 107 mmol/L (ref 98–111)
Creatinine, Ser: 0.58 mg/dL (ref 0.44–1.00)
GFR, Estimated: >60 mL/min (ref >60)
Glucose, Bld: 120 mg/dL — ABNORMAL HIGH (ref 70–99)
Potassium: 3.5 mmol/L (ref 3.5–5.1)
Sodium: 135 mmol/L (ref 135–145)

## 2022-09-25 MED ORDER — CHLORHEXIDINE GLUCONATE CLOTH 2 % EX PADS
6.0000 | MEDICATED_PAD | Freq: Every day | CUTANEOUS | Status: DC
  Administered 2022-09-26 – 2022-10-01 (×6): 6 via TOPICAL

## 2022-09-25 MED ORDER — SODIUM CHLORIDE 0.9% FLUSH: 10.0000 mL | INTRAVENOUS | Status: DC | PRN

## 2022-09-25 MED ORDER — ACETAMINOPHEN 10 MG/ML IV SOLN
1000.0000 mg | Freq: Four times a day (QID) | INTRAVENOUS | Status: AC
  Administered 2022-09-25 – 2022-09-26 (×3): 1000 mg via INTRAVENOUS
  Filled 2022-09-25 (×4): qty 100

## 2022-09-25 MED ORDER — TRAVASOL 10 % IV SOLN
INTRAVENOUS | Status: AC
  Filled 2022-09-25: qty 360

## 2022-09-25 MED ORDER — ONDANSETRON HCL 4 MG/2ML IJ SOLN
4.0000 mg | Freq: Four times a day (QID) | INTRAMUSCULAR | Status: DC | PRN
  Administered 2022-09-27: 4 mg via INTRAVENOUS
  Filled 2022-09-25: qty 2

## 2022-09-25 MED ORDER — GABAPENTIN 300 MG PO CAPS
300.0000 mg | ORAL_CAPSULE | Freq: Three times a day (TID) | ORAL | Status: DC
  Administered 2022-09-25 – 2022-09-26 (×6): 300 mg via ORAL
  Filled 2022-09-25 (×7): qty 1

## 2022-09-25 MED ORDER — SODIUM CHLORIDE 0.9% FLUSH: 10.0000 mL | Freq: Two times a day (BID) | INTRAVENOUS | Status: DC

## 2022-09-25 MED ORDER — DEXTROSE IN LACTATED RINGERS 5 % IV SOLN: INTRAVENOUS | Status: AC

## 2022-09-25 MED ORDER — INSULIN ASPART 100 UNIT/ML IJ SOLN: 0.0000 [IU] | Freq: Four times a day (QID) | INTRAMUSCULAR | Status: DC

## 2022-09-25 NOTE — TOC Progression Note (Signed)
Transition of Care [facility]) - Progression Note    Patient Details  Name: SALINA STANFIELD MRN: [id number] Date of Birth: [birth year]  Transition of Care [facility]) CM/SW Contact  Howell Rucks, RN Phone Number: 09/25/2022, 11:53 AM  Clinical Narrative:   Call received from Gwendolyn Fill with [facility],  phone: [phone], confirmed they are assigned as  pt's primary care provider under her medical insurance,  available to assist with dc planning. Annice Pih reports she will outreach pt to confirm pt agreeable to have them as her PCP. Pt underwent Exploratory Lap w/hemocolectomy on 7/9, currently on TPN for malnutrition per medical documentation. TOC will continue to follow.          Expected Discharge Plan and Services                                               Social Determinants of Health (SDOH) Interventions SDOH Screenings   Food Insecurity: No Food Insecurity (09/22/2022)  Housing: Low Risk  (09/22/2022)  Transportation Needs: No Transportation Needs (09/22/2022)  Utilities: Not At Risk (09/22/2022)  Tobacco Use: Low Risk  (09/25/2022)    Readmission Risk Interventions     No data to display

## 2022-09-25 NOTE — Progress Notes (Signed)
Responded to consult to assess line due to pt report of pain at site. Dressing change to assess site. Pt pinpoints pain to location of exit site; no signs of malposition/bruising. Discussed that possible cause of pain may be due to recent placement of line. Pt reports edema below exit site near the elbow; recommend considering [place] to evaluate for DVT. RN notified. Pt states she wants PICC line removed if pain and edema are going to continue. Discussed PICC line necessity due to TPN order. Pt agrees to plan to notify MD of findings and possible [place].

## 2022-09-25 NOTE — Anesthesia Postprocedure Evaluation (Signed)
Anesthesia Post Note  Patient: Meghan Holland  Procedure(s) Performed: EXPLORATORY LAPAROTOMY WITH RIGHT HEMICOLECTOMY (Abdomen)     Patient location during evaluation: PACU Anesthesia Type: General Level of consciousness: sedated and patient cooperative Pain management: pain level controlled Vital Signs Assessment: post-procedure vital signs reviewed and stable Respiratory status: spontaneous breathing Cardiovascular status: stable Anesthetic complications: no   No notable events documented.  Last Vitals:  Vitals:   09/24/22 1955 09/25/22 0650  BP: 109/75 113/73  Pulse: 70 91  Resp: 17 18  Temp: 36.8 C 37.9 C  SpO2: 100% 100%    Last Pain:  Vitals:   09/25/22 0800  TempSrc:   PainSc: 9                  Nilton Lave

## 2022-09-25 NOTE — Progress Notes (Addendum)
Initial Nutrition Assessment  DOCUMENTATION CODES:   Severe malnutrition in context of chronic illness  INTERVENTION:   Monitor magnesium, potassium, and phosphorus BID for at least 3 days, MD to replete as needed, as pt is at risk for refeeding syndrome.  -TPN management per Pharmacy -Recommend 100 mg Thiamine daily for at least 5 days given refeeding risk -Daily weights while on TPN  NUTRITION DIAGNOSIS:   Severe Malnutrition related to chronic illness as evidenced by moderate fat depletion, moderate muscle depletion, energy intake < or equal to 75% for > or equal to 1 month, percent weight loss   GOAL:   Patient will meet greater than or equal to 90% of their needs  MONITOR:   Labs, Weight trends, I & O's (TPN)  REASON FOR ASSESSMENT:   Consult, Malnutrition Screening Tool New TPN/TNA  ASSESSMENT:   37 year old female with medical history of iron deficiency anemia presented to [facility] ED with complaints of worsening abdominal cramping with radiation to her back.  Patient states that she had no appetite and lost about 40 pounds since August 2023.  Also had 3 episodes of bloody stools. CTA abdomen/pelvis showed small bowel intussusception and hyperenhancing mass lesion in the central abdomen measuring 4 x 3.7 cm.  Multiple air-fluid bubbles noted in small bowel, SBO.  Also found to have hypoenhancing lesion in the right lobe of liver, suspicious for metastasis.  7/7: admitted 7/9: s/p Exploratory laparotomy, right colectomy   Patient in room, multiple family members at bedside. Pt reports she has not been able to tolerate food for a while, for months. At most may eat some of a hamburger and 1 piece of pizza. Eventullay tried to limit certain foods as part of the FODMAP diet to help her symptoms. Lately pt has not eaten at all. Drinks water, gatorade and had tried to drink Ensure PTA as well. Pt  has been NPO since admission 7/7.   TPN to start at 30 ml/hr today, providing ~655  kcals and 36g protein.  Admission weight: 98 lbs Needs daily weights while on TPN. Pt reports that her weight has decreased from ~170 lbs in August 2023, now at her lowest weight, ~98-101 lbs. This is a 72 lb weight loss (42% wt loss x 11 months, significant for time frame).   Medications: D5 infusion,  Labs reviewed.  NUTRITION - FOCUSED PHYSICAL EXAM:  Flowsheet Row Most Recent Value  Orbital Region Mild depletion  Upper Arm Region Severe depletion  Thoracic and Lumbar Region Moderate depletion  Buccal Region Mild depletion  Temple Region Moderate depletion  Clavicle Bone Region Moderate depletion  Clavicle and Acromion Bone Region Moderate depletion  Scapular Bone Region Moderate depletion  Dorsal Hand Severe depletion  Patellar Region Moderate depletion  Anterior Thigh Region Moderate depletion  Posterior Calf Region Moderate depletion  Edema (RD Assessment) None  Hair Unable to assess  [covered]  Eyes Reviewed  Mouth Reviewed  Skin Reviewed  Nails Reviewed       Diet Order:   Diet Order             Diet NPO time specified Except for: Sips with Meds  Diet effective midnight                   EDUCATION NEEDS:   Education needs have been addressed  Skin:  Skin Assessment: Skin Integrity Issues: Skin Integrity Issues:: Incisions Incisions: 7/9 abdomen  Last BM:  7/9  Height:   Ht Readings from Last 1  Encounters:  09/22/22 5\' 8"  (1.727 m)    Weight:   Wt Readings from Last 1 Encounters:  09/27/22 52.3 kg    BMI:  Body mass index is 17.53 kg/m.  Estimated Nutritional Needs:   Kcal:  1700-1900  Protein:  85-95g  Fluid:  1.9L/day   Tilda Franco, MS, RD, LDN Inpatient Clinical Dietitian Contact information available via Amion

## 2022-09-25 NOTE — Progress Notes (Signed)
PROGRESS NOTE  Meghan Holland  DOB: 05/31/1985  PCP: Patient, No Pcp Per UJW:[id number]  DOA: 09/22/2022  LOS: 2 days  Hospital Day: 4  Brief narrative: Meghan Holland is a 37 y.o. female with PMH significant for iron deficiency anemia 7/7, patient presented to the ED with complaint of abdominal cramping pain radiating to back, black stool, loss of appetite, 40 pounds weight loss in a year. In the ED, she had a low hemoglobin of 5.3, FOBT positive CTA abdomen/pelvis showed small bowel intussusception and hyperenhancing mass lesion in the central abdomen measuring 4 x 3.7 cm.   Multiple air-fluid bubbles were noted in small bowel, SBO.   She was also found to have hypoenhancing lesion in the right lobe of liver, suspicious for metastasis. General surgery was consulted. 7/9, patient underwent ex lap with right hemicolectomy   Subjective Patient was seen and examined this morning.  Young African-American female. Lying down on bed.  Multiple family members at bedside. Remains n.p.o.  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 Fredricka Bonine Currently in postop ileus. N.p.o.  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  Also has liver mets which may need biopsy once clinical condition improves.  Severe protein calorie malnutrition Secondary to malignancy.   Noted a plan to start TPN because of anticipated ileus  Severe anemia Chronic iron deficiency anemia Likely from chronic GI blood loss due to intra-abdominal mass Hemoglobin low at 5.3.  Ferritin level was severely low at 2. 2 units PRBC transfused. Hemoglobin at 7.4 today.  Continue to monitor.  Will also benefit from IV iron infusion. Recent Labs    09/22/22 1546 09/22/22 2100 09/23/22 0020 09/24/22 0528 09/24/22 0719 09/24/22 1045 09/24/22 1048 09/25/22 0528  HGB 5.3*  --    < > 8.3* 7.6* 11.2* 9.5* 7.4*   MCV 67.4*  --    < > 76.5*  --   --  76.8* 76.1*  VITAMINB12  --  250  --   --   --   --   --   --   FOLATE  --  7.9  --   --   --   --   --   --   FERRITIN  --  2*  --   --   --   --   --   --   TIBC  --  508*  --   --   --   --   --   --   IRON  --  5*  --   --   --   --   --   --   RETICCTPCT 2.3  --   --   --   --   --   --   --    < > = values in this interval not displayed.   Hypokalemia Potassium level improved with replacement. Recent Labs  Lab 09/22/22 1546 09/23/22 0020 09/24/22 0528 09/24/22 1045 09/25/22 0528  K 3.0* 3.6 3.4* 3.8 3.5  MG  --  1.8  --   --   --   PHOS  --  3.5  --   --   --    Mobility: Encourage ambulation  Goals of care   Code Status: Full Code     DVT prophylaxis:  SCDs Start: 09/22/22 2307   Antimicrobials: Perioperative antibiotics Fluid: TPN Consultants: General surgery Family Communication: Multiple  family members at bedside  Status: Inpatient Level of care:  Progressive   Patient from: Home Anticipated d/c to: Pending clinical course Needs to continue in-hospital care:  postop ileus   Diet:  Diet Order             Diet NPO time specified Except for: Sips with Meds, Ice Chips, Other (See Comments)  Diet effective now                   Scheduled Meds:  gabapentin  300 mg Oral TID   insulin aspart  0-9 Units Subcutaneous Q6H   mupirocin ointment  1 Application Nasal BID    PRN meds: HYDROmorphone (DILAUDID) injection, melatonin, ondansetron (ZOFRAN) IV, mouth rinse, oxyCODONE, polyethylene glycol, prochlorperazine   Infusions:   acetaminophen 1,000 mg (09/25/22 0945)   dextrose 5% lactated ringers 100 mL/hr at 09/25/22 1202   dextrose 5% lactated ringers     methocarbamol (ROBAXIN) IV 500 mg (09/25/22 1226)   TPN ADULT (ION)      Antimicrobials: Anti-infectives (From admission, onward)    Start     Dose/Rate Route Frequency Ordered Stop   09/24/22 0800  ceFAZolin (ANCEF) IVPB 2g/100 mL premix  Status:   Discontinued        2 g 200 mL/hr over 30 Minutes Intravenous On call to O.R. 09/24/22 0712 09/24/22 1524       Nutritional status:  Body mass index is 15.42 kg/m.  Nutrition Problem: Severe Malnutrition Etiology: chronic illness Signs/Symptoms: moderate fat depletion, moderate muscle depletion     Objective: Vitals:   09/25/22 0650 09/25/22 1229  BP: 113/73 114/77  Pulse: 91 79  Resp: 18   Temp: 100.2 F (37.9 C) 99.1 F (37.3 C)  SpO2: 100% 100%    Intake/Output Summary (Last 24 hours) at 09/25/2022 1638 Last data filed at 09/25/2022 1500 Gross per 24 hour  Intake 47.89 ml  Output 1850 ml  Net -1802.11 ml   Filed Weights   09/22/22 1541 09/22/22 2317  Weight: 44.6 kg 46 kg   Weight change:  Body mass index is 15.42 kg/m.   Physical Exam: General exam: Pleasant young African-American female. Skin: No rashes, lesions or ulcers. HEENT: Atraumatic, normocephalic, no obvious bleeding Lungs: Clear to auscultation bilaterally CVS: Regular rate and rhythm, no murmur GI/Abd soft, appropriate postop tenderness, bowel sounds sluggish CNS: Alert, awake, oriented x 3 Psychiatry: Mood appropriate Extremities: No pedal edema, no calf redness  Data Review: I have personally reviewed the laboratory data and studies available.  F/u labs ordered Unresulted Labs (From admission, onward)     Start     Ordered   09/30/22 0500  Triglycerides  (Standard TPN Labs (all labs to be drawn at 0500))  Every Monday (0500),   R     Question:  Specimen collection method  Answer:  Unit=Unit collect   09/25/22 0828   09/26/22 0500  Magnesium  (Standard TPN Labs (all labs to be drawn at 0500))  Every Mon,Thu (0500),   R     Question:  Specimen collection method  Answer:  Unit=Unit collect   09/25/22 0828   09/26/22 0500  Phosphorus  (Standard TPN Labs (all labs to be drawn at 0500))  Every Mon,Thu (0500),   R     Question:  Specimen collection method  Answer:  Unit=Unit collect    09/25/22 0828   09/26/22 0500  CBC  Tomorrow morning,   R       Question:  Specimen collection method  Answer:  Unit=Unit collect   09/25/22 0830   09/26/22 0500  Basic metabolic panel  Tomorrow morning,   R       Question:  Specimen collection method  Answer:  Unit=Unit collect   09/25/22 0830   09/26/22 0500  Magnesium  Tomorrow morning,   R       Question:  Specimen collection method  Answer:  Unit=Unit collect   09/25/22 0830   09/26/22 0500  Comprehensive metabolic panel  (TPN Lab Panel)  Every Mon,Thu (0500),   R     Question:  Specimen collection method  Answer:  Unit=Unit collect   09/25/22 0852   09/26/22 0500  Magnesium  (TPN Lab Panel)  Every Mon,Thu (0500),   R     Question:  Specimen collection method  Answer:  Unit=Unit collect   09/25/22 0852   09/26/22 0500  Phosphorus  (TPN Lab Panel)  Every Mon,Thu (0500),   R     Question:  Specimen collection method  Answer:  Unit=Unit collect   09/25/22 0852   09/26/22 0500  Triglycerides  (TPN Lab Panel)  Every Monday (0500),   R     Question:  Specimen collection method  Answer:  Unit=Unit collect   09/25/22 0852   09/25/22 0908  Hemoglobin A1c  Once,   R       Comments: To assess prior glycemic control   Question:  Specimen collection method  Answer:  Unit=Unit collect   09/25/22 0908            Total time spent in review of labs and imaging, patient evaluation, formulation of plan, documentation and communication with family: 45 minutes  Signed, Lorin Glass, MD [facility] 09/25/2022

## 2022-09-25 NOTE — Progress Notes (Signed)
1 Day Post-Op   Subjective/Chief Complaint: Restless night due to staff coming in and out, pain control is not great right now.  No nausea, no flatus or bowel movement.   Objective: Vital signs in last 24 hours: Temp:  [97.7 F (36.5 C)-100.2 F (37.9 C)] 100.2 F (37.9 C) (07/10 0650) Pulse Rate:  [53-91] 91 (07/10 0650) Resp:  [11-18] 18 (07/10 0650) BP: (104-122)/(73-88) 113/73 (07/10 0650) SpO2:  [100 %] 100 % (07/10 0650) Last BM Date : 09/24/22  Intake/Output from previous day: 07/09 0701 - 07/10 0700 In: 3757.9 [I.V.:3110; IV Piggyback:647.9] Out: 1330 [Urine:1300; Blood:30] Intake/Output this shift: No intake/output data recorded.  Alert, calm, no distress Unlabored respirations Abdomen is soft, distended, appropriately tender.  Incision is clean, dry and intact; there is some blood staining on honeycomb but no active bleeding or drainage.  Lab Results:  Recent Labs    09/24/22 1048 09/25/22 0528  WBC 9.1 8.4  HGB 9.5* 7.4*  HCT 32.2* 24.5*  PLT 346 271   BMET Recent Labs    09/24/22 0528 09/24/22 1045 09/25/22 0528  NA 136 138 135  K 3.4* 3.8 3.5  CL 108 107 107  CO2 20*  --  22  GLUCOSE 93 80 120*  BUN 8 5* <5*  CREATININE 0.67 0.60 0.58  CALCIUM 8.8*  --  8.2*   PT/INR No results for input(s): "LABPROT", "INR" in the last 72 hours. ABG No results for input(s): "PHART", "HCO3" in the last 72 hours.  Invalid input(s): "PCO2", "PO2"  Studies/Results: No results found.  Anti-infectives: Anti-infectives (From admission, onward)    Start     Dose/Rate Route Frequency Ordered Stop   09/24/22 0800  ceFAZolin (ANCEF) IVPB 2g/100 mL premix  Status:  Discontinued        2 g 200 mL/hr over 30 Minutes Intravenous On call to O.R. 09/24/22 0712 09/24/22 1524       Assessment/Plan:  Small bowel mass with ileocolonic intussusception-status post exploratory laparotomy with right hemicolectomy 09/24/2022 Liver lesion concerning for metastasis-will  ultimately need biopsy, will hold off for now Iron deficiency anemia-hemoglobin acceptably improved status post 3 units PRBC, continue to monitor as she may need additional transfusion as she equilibrates further after surgery.  Hemoglobin 7.4 this morning Severe protein calorie malnutrition-patient agreeable to TPN, will go ahead and initiate this given malnutrition that predates surgery and anticipated ileus Hypokalemia 3.5-replace IV    LOS: 2 days    Berna Bue 09/25/2022

## 2022-09-25 NOTE — Progress Notes (Signed)
Peripherally Inserted Central Catheter Placement  The IV Nurse has discussed with the patient and/or persons authorized to consent for the patient, the purpose of this procedure and the potential benefits and risks involved with this procedure.  The benefits include less needle sticks, lab draws from the catheter, and the patient may be discharged home with the catheter. Risks include, but not limited to, infection, bleeding, blood clot (thrombus formation), and puncture of an artery; nerve damage and irregular heartbeat and possibility to perform a PICC exchange if needed/ordered by physician.  Alternatives to this procedure were also discussed.  Bard Power PICC patient education guide, fact sheet on infection prevention and patient information card has been provided to patient /or left at bedside.    PICC Placement Documentation  PICC Double Lumen 09/25/22 Right Basilic 42 cm 1 cm (Active)  Indication for Insertion or Continuance of Line Administration of hyperosmolar/irritating solutions (i.e. TPN, Vancomycin, etc.) 09/25/22 1700  Exposed Catheter (cm) 1 cm 09/25/22 1700  Site Assessment Clean, Dry, Intact 09/25/22 1700  Lumen #1 Status Flushed;Saline locked;Blood return noted 09/25/22 1700  Lumen #2 Status Flushed;Saline locked;Blood return noted 09/25/22 1700  Dressing Type Transparent;Securing device 09/25/22 1700  Dressing Status Antimicrobial disc in place;Clean, Dry, Intact 09/25/22 1700  Safety Lock Not Applicable 09/25/22 1700  Line Care Connections checked and tightened 09/25/22 1700  Line Adjustment (NICU/IV Team Only) No 09/25/22 1700  Dressing Intervention New dressing 09/25/22 1700  Dressing Change Due 10/02/22 09/25/22 1700       Timmothy Sours 09/25/2022, 5:35 PM

## 2022-09-25 NOTE — Progress Notes (Signed)
PHARMACY - TOTAL PARENTERAL NUTRITION CONSULT NOTE   Indication:  severe malnutrition, ileus anticipated s/p surgery - Small bowel mass with ileocolonic intussusception-status post exploratory laparotomy with right hemicolectomy 09/24/2022.   Patient Measurements: Height: 5\' 8"  (172.7 cm) Weight: 46 kg (101 lb 6.6 oz) IBW/kg (Calculated) : 63.9 TPN AdjBW (KG): 46 Body mass index is 15.42 kg/m. Usual Weight:   Assessment: Small bowel mass with ileocolonic intussusception-status post exploratory laparotomy with right hemicolectomy 09/24/2022. 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 - PMH: underweight (lost 40lbs since 8/23)  Glucose / Insulin: No h/o DM. 120 on BMET Electrolytes: WNL Renal: Scr <1 Hepatic: LFT's WNl on 09/22/22 Intake / Output; MIVF:  - MIVF: D5LR at 136ml/hr  GI Imaging: 7/7: CT angio: Small bowel mass with ileocolonic intussusception, liver lesion suspicious for mets.  GI Surgeries / Procedures:  7/9: exploratory laparotomy with right hemicolectomy  Central access: Place PICC 7/10 TPN start date: 09/25/22  Nutritional Goals: Goal TPN rate is ____ mL/hr (provides ____ g of protein and ____ kcals per day)  RD Assessment: consulted  Current Nutrition:  NPO  Plan:  Start TPN at 30 mL/hr at 1800 Electrolytes in TPN: Na 62mEq/L, K 69mEq/L, Ca 80mEq/L, Mg 33mEq/L, and Phos 2mmol/L. Cl:Ac 1:1 Add standard MVI and trace elements to TPN Initiate Sensitive q6h SSI and adjust as needed  Reduce MIVF to 70 mL/hr at 1800 Monitor TPN labs on Mon/Thurs,  AND prn F/U RD consult for goal.   Benny Deutschman S. Merilynn Finland, PharmD, BCPS Clinical Staff Pharmacist [email or web address] Merilynn Finland, Amira Podolak Stillinger 09/25/2022,8:32 AM

## 2022-09-25 NOTE — Progress Notes (Addendum)
Patient refused PICC placement in the middle of explaining the risk and benefits. Primary RN made aware.  1730 Patient agreed to place PICC after primary RN spoke to her.

## 2022-09-26 ENCOUNTER — Other Ambulatory Visit: Payer: Self-pay

## 2022-09-26 ENCOUNTER — Inpatient Hospital Stay (HOSPITAL_COMMUNITY): Payer: Medicaid Other

## 2022-09-26 DIAGNOSIS — M7989 Other specified soft tissue disorders: Secondary | ICD-10-CM

## 2022-09-26 DIAGNOSIS — K561 Intussusception: Secondary | ICD-10-CM | POA: Diagnosis not present

## 2022-09-26 LAB — COMPREHENSIVE METABOLIC PANEL
ALT: 12 U/L (ref 0–44)
AST: 19 U/L (ref 15–41)
Albumin: 2.5 g/dL — ABNORMAL LOW (ref 3.5–5.0)
Alkaline Phosphatase: 28 U/L — ABNORMAL LOW (ref 38–126)
Anion gap: 6 (ref 5–15)
BUN: 5 mg/dL — ABNORMAL LOW (ref 6–20)
CO2: 23 mmol/L (ref 22–32)
Calcium: 7.6 mg/dL — ABNORMAL LOW (ref 8.9–10.3)
Chloride: 104 mmol/L (ref 98–111)
Creatinine, Ser: 0.52 mg/dL (ref 0.44–1.00)
GFR, Estimated: >60 mL/min (ref >60)
Glucose, Bld: 100 mg/dL — ABNORMAL HIGH (ref 70–99)
Potassium: 3.1 mmol/L — ABNORMAL LOW (ref 3.5–5.1)
Sodium: 133 mmol/L — ABNORMAL LOW (ref 135–145)
Total Bilirubin: 0.5 mg/dL (ref 0.3–1.2)
Total Protein: 5.3 g/dL — ABNORMAL LOW (ref 6.5–8.1)

## 2022-09-26 LAB — PREPARE RBC (CROSSMATCH)

## 2022-09-26 LAB — HEMOGLOBIN A1C
Hgb A1c MFr Bld: 4.7 % — ABNORMAL LOW (ref 4.8–5.6)
Mean Plasma Glucose: 88 mg/dL

## 2022-09-26 LAB — CBC
HCT: 21.3 % — ABNORMAL LOW (ref 36.0–46.0)
Hemoglobin: 6.4 g/dL — CL (ref 12.0–15.0)
MCH: 23.4 pg — ABNORMAL LOW (ref 26.0–34.0)
MCHC: 30 g/dL (ref 30.0–36.0)
MCV: 77.7 fL — ABNORMAL LOW (ref 80.0–100.0)
Platelets: 248 10*3/uL (ref 150–400)
RBC: 2.74 MIL/uL — ABNORMAL LOW (ref 3.87–5.11)
RDW: 21.5 % — ABNORMAL HIGH (ref 11.5–15.5)
WBC: 6.9 10*3/uL (ref 4.0–10.5)
nRBC: 0 % (ref 0.0–0.2)

## 2022-09-26 LAB — GLUCOSE, CAPILLARY
Glucose-Capillary: 88 mg/dL (ref 70–99)
Glucose-Capillary: 90 mg/dL (ref 70–99)
Glucose-Capillary: 92 mg/dL (ref 70–99)
Glucose-Capillary: 95 mg/dL (ref 70–99)
Glucose-Capillary: 95 mg/dL (ref 70–99)

## 2022-09-26 LAB — PHOSPHORUS: Phosphorus: 4.2 mg/dL (ref 2.5–4.6)

## 2022-09-26 LAB — MAGNESIUM: Magnesium: 1.5 mg/dL — ABNORMAL LOW (ref 1.7–2.4)

## 2022-09-26 LAB — TRIGLYCERIDES: Triglycerides: 38 mg/dL (ref <150)

## 2022-09-26 MED ORDER — POTASSIUM CHLORIDE 10 MEQ/100ML IV SOLN
10.0000 meq | INTRAVENOUS | Status: DC
  Administered 2022-09-26 (×2): 10 meq via INTRAVENOUS
  Filled 2022-09-26 (×3): qty 100

## 2022-09-26 MED ORDER — TRAVASOL 10 % IV SOLN
INTRAVENOUS | Status: AC
  Filled 2022-09-26: qty 324

## 2022-09-26 MED ORDER — ACETAMINOPHEN 500 MG PO TABS: 1000.0000 mg | ORAL_TABLET | Freq: Four times a day (QID) | ORAL | Status: DC | PRN

## 2022-09-26 MED ORDER — POTASSIUM CHLORIDE 10 MEQ/100ML IV SOLN
10.0000 meq | INTRAVENOUS | Status: AC
  Administered 2022-09-26 (×4): 10 meq via INTRAVENOUS
  Filled 2022-09-26 (×3): qty 100

## 2022-09-26 MED ORDER — MAGNESIUM SULFATE 4 GM/100ML IV SOLN
4.0000 g | Freq: Once | INTRAVENOUS | Status: AC
  Administered 2022-09-26: 4 g via INTRAVENOUS
  Filled 2022-09-26: qty 100

## 2022-09-26 MED ORDER — THIAMINE HCL 100 MG/ML IJ SOLN
100.0000 mg | Freq: Every day | INTRAMUSCULAR | Status: DC
  Administered 2022-09-26 – 2022-09-30 (×5): 100 mg via INTRAVENOUS
  Filled 2022-09-26 (×6): qty 2

## 2022-09-26 MED ORDER — SODIUM CHLORIDE 0.9% IV SOLUTION: Freq: Once | INTRAVENOUS | Status: AC

## 2022-09-26 NOTE — Progress Notes (Signed)
    Patient Name: Meghan Holland           DOB: [birth year]  MRN: [id number]      Admission Date: 09/22/2022  Attending Provider: Lorin Glass, MD  Primary Diagnosis: Intussusception of small bowel [facility])   Level of care: Progressive    CROSS COVER NOTE   Date of Service   09/26/2022   [name], 37 y.o. female, was admitted on 09/22/2022 for Intussusception of small bowel (HCC).    HPI/Events of Note   Chronic iron deficiency anemia Hemoglobin 7.4 -->  6.4.  No acute changes reported.  Hemodynamically stable. No melena, hematochezia, or other bleeding reported tonight.    Interventions/ Plan   Blood transfusion, 1 unit PRBC         Anthoney Harada, DNP, [employer] [facility] [place]

## 2022-09-26 NOTE — Progress Notes (Signed)
PHARMACY - TOTAL PARENTERAL NUTRITION CONSULT NOTE   Indication:  severe malnutrition, ileus anticipated s/p surgery - Small bowel mass with ileocolonic intussusception-status post exploratory laparotomy with right hemicolectomy 09/24/2022.   Patient Measurements: Height: 5\' 8"  (172.7 cm) Weight: 46.2 kg (101 lb 13.6 oz) IBW/kg (Calculated) : 63.9 TPN AdjBW (KG): 46 Body mass index is 15.49 kg/m. Usual Weight:   Assessment: Small bowel mass with ileocolonic intussusception-status post exploratory laparotomy with right hemicolectomy 09/24/2022. 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 - PMH: underweight (lost 40lbs since 8/23)  Glucose / Insulin:  - No h/o DM.  - CBG 90-92 ( Goal <150)  Electrolytes:  - K+ low at 3.1  - Na small drop to 133 today  - CorrCa is borderline low at 8.8 - Magnesium low at 1.5  - others are WNL  Renal:  - Scr <1 Hepatic:  - LFT's WNL Intake / Output; MIVF:  - MIVF: D5LR at 17ml/hr  GI Imaging: 7/7: CT angio: Small bowel mass with ileocolonic intussusception, liver lesion suspicious for mets.  GI Surgeries / Procedures:  7/9: exploratory laparotomy with right hemicolectomy  Central access: Place PICC 7/10 TPN start date: 09/25/22  Nutritional Goals: Goal TPN rate is 85  mL/hr (provides 91.8 g of protein and 1815 kcals per day)  RD Assessment: Estimated Nutritional Needs:  ( 7/10)  Kcal:  1700-1900  Protein:  85-95g  Fluid:  1.9L/day    Current Nutrition:  NPO  Plan:  - KCl 10 meq IV x 6 ordered  - Magnesium 4 gr IV x1 ordered   Continue  TPN to 30 mL/hr at 1800. Per MD note, concern for refeeding syndrome, so will not advance Electrolytes in TPN:  Inc Na 69mEq/L  Inc K 29mEq/L  Inc Ca 3mEq/L,  Inc Mg 38mEq/L Phos 40mmol/L. Cl:Ac 1:1 Add standard MVI and trace elements to TPN Initiate Sensitive q6h SSI and adjust as needed  Continue  MIVF at 70 mL/hr at 1800 BMP, magnesium, phosphorus with AM labs  Monitor TPN labs on  Mon/Thurs,  AND prn  Adalberto Cole, PharmD, BCPS 09/26/2022 11:17 AM

## 2022-09-26 NOTE — Progress Notes (Signed)
I had a brief discussion with the patient and her husband regarding the preliminary pathology and likely treatment plan.  We will arrange for outpatient follow-up with the Cancer center.  Please call oncology as needed while she is here.

## 2022-09-26 NOTE — Plan of Care (Signed)
  Problem: Nutrition: Goal: Adequate nutrition will be maintained Outcome: Progressing   Problem: Pain Managment: Goal: General experience of comfort will improve Outcome: Progressing   Problem: Fluid Volume: Goal: Ability to maintain a balanced intake and output will improve Outcome: Progressing   Problem: Nutritional: Goal: Maintenance of adequate nutrition will improve Outcome: Progressing   Problem: Education: Goal: Knowledge of General Education information will improve Description: Including pain rating scale, medication(s)/side effects and non-pharmacologic comfort measures Outcome: Adequate for Discharge   Problem: Activity: Goal: Risk for activity intolerance will decrease Outcome: Adequate for Discharge   Problem: Safety: Goal: Ability to remain free from injury will improve Outcome: Adequate for Discharge   Problem: Skin Integrity: Goal: Risk for impaired skin integrity will decrease Outcome: Completed/Met   Problem: Skin Integrity: Goal: Risk for impaired skin integrity will decrease Outcome: Completed/Met

## 2022-09-26 NOTE — Progress Notes (Signed)
Right upper extremity venous study completed.   Preliminary results relayed to MD and RN.  Please see CV Procedures for preliminary results.  Christene Lye, RVT  11:37 AM 09/26/22

## 2022-09-26 NOTE — Progress Notes (Addendum)
2 Days Post-Op  Subjective: CC: Patient reports no abdominal pain this morning. She required dilaudid x 1 yesterday am and oxy x 2 yesterday afternoon/evening. No PRN meds this am. She just started clears and has finished a small apple juice between last night and this morning. No n/v. No flatus or bm since surgery. Voiding without issues. Mobilizing in the halls.   Afebrile. No tachycardia or hypotension. WBC wnl. Hgb noted to be 6.4 this am. She reports she was already notified of this and PRBC has been ordered. K 3.1, Mg 1.5 - replacement has already been ordered.   Objective: Vital signs in last 24 hours: Temp:  [98.4 F (36.9 C)-99.1 F (37.3 C)] 98.4 F (36.9 C) (07/11 0511) Pulse Rate:  [76-79] 76 (07/11 0511) Resp:  [16-18] 16 (07/11 0511) BP: (96-114)/(67-80) 105/68 (07/11 0524) SpO2:  [100 %] 100 % (07/11 0511) Weight:  [46.2 kg] 46.2 kg (07/11 0559) Last BM Date : 09/24/22  Intake/Output from previous day: 07/10 0701 - 07/11 0700 In: 5103.2 [P.O.:40; I.V.:4691.1; IV Piggyback:372.1] Out: 1250 [Urine:1250] Intake/Output this shift: No intake/output data recorded.  PE: Gen:  Alert, NAD, pleasant Abd: Soft, mild distension, appropriately tender around her  incision, no rigidity or guarding and otherwise NT, +BS. Incision is clean, dry and intact; there is some blood staining on honeycomb but no active bleeding or drainage.   Lab Results:  Recent Labs    09/25/22 0528 09/26/22 0522  WBC 8.4 6.9  HGB 7.4* 6.4*  HCT 24.5* 21.3*  PLT 271 248   BMET Recent Labs    09/25/22 0528 09/26/22 0522  NA 135 133*  K 3.5 3.1*  CL 107 104  CO2 22 23  GLUCOSE 120* 100*  BUN <5* 5*  CREATININE 0.58 0.52  CALCIUM 8.2* 7.6*   PT/INR No results for input(s): "LABPROT", "INR" in the last 72 hours. CMP     Component Value Date/Time   NA 133 (L) 09/26/2022 0522   K 3.1 (L) 09/26/2022 0522   CL 104 09/26/2022 0522   CO2 23 09/26/2022 0522   GLUCOSE 100 (H)  09/26/2022 0522   BUN 5 (L) 09/26/2022 0522   CREATININE 0.52 09/26/2022 0522   CALCIUM 7.6 (L) 09/26/2022 0522   PROT 5.3 (L) 09/26/2022 0522   ALBUMIN 2.5 (L) 09/26/2022 0522   AST 19 09/26/2022 0522   ALT 12 09/26/2022 0522   ALKPHOS 28 (L) 09/26/2022 0522   BILITOT 0.5 09/26/2022 0522   GFRNONAA >60 09/26/2022 0522   GFRAA >60 08/02/2014 0337   Lipase     Component Value Date/Time   LIPASE 23 09/22/2022 1546    Studies/Results: [place] EKG SITE RITE  Result Date: 09/25/2022 If Site Rite image not attached, placement could not be confirmed due to current cardiac rhythm.   Anti-infectives: Anti-infectives (From admission, onward)    Start     Dose/Rate Route Frequency Ordered Stop   09/24/22 0800  ceFAZolin (ANCEF) IVPB 2g/100 mL premix  Status:  Discontinued        2 g 200 mL/hr over 30 Minutes Intravenous On call to O.R. 09/24/22 0712 09/24/22 1524        Assessment/Plan POD 2 s/p exploratory laparotomy with right hemicolectomy for Small bowel intussusception with associated mass, partial obstruction and GI bleed on 09/24/2022 by Dr. Fredricka Bonine - Path pending - Liver lesion concerning for metastasis was not amenable/safe for bx intra-op. Will await surgical pathology but likely will ultimately need biopsy - CLD.  AROBF - Mobilize - Pulm toilet ABL anemia - Required 3U PRBC pre-op. Hgb has drifted down to 6.4 this am. 1U PRBC ordered. Will f/u on post transfusion hgb to ensure proper response. She is HDS currently without tachycardia or hypotension.  Severe protein calorie malnutrition - Cont TPN Hypokalemia - 3.1. Replace  Hypomagnesemia - 1.5. Replace   FEN - CLD. IVF per TRH. PICC/TPN VTE - SCDs, chem ppx on hold for abl anemia  ID - Peri-op abx. None currently.  Foley - None, spont void   LOS: 3 days    Jacinto Halim , [facility] Surgery 09/26/2022, 9:12 AM Please see Amion for pager number during day hours 7:00am-4:30pm

## 2022-09-26 NOTE — Progress Notes (Signed)
PROGRESS NOTE  Meghan Holland  DOB: 24-Apr-1985  PCP: Patient, No Pcp Per UJW:[id number]  DOA: 09/22/2022  LOS: 3 days  Hospital Day: 5  Brief narrative: Meghan Holland is a 37 y.o. female with PMH significant for iron deficiency anemia 7/7, patient presented to the ED with complaint of abdominal cramping pain radiating to back, black stool, loss of appetite, 40 pounds weight loss in a year. In the ED, she had a low hemoglobin of 5.3, FOBT positive CTA abdomen/pelvis showed small bowel intussusception and hyperenhancing mass lesion in the central abdomen measuring 4 x 3.7 cm.   Multiple air-fluid bubbles were noted in small bowel, SBO.   She was also found to have hypoenhancing lesion in the right lobe of liver, suspicious for metastasis. General surgery was consulted. 7/9, patient underwent ex lap with right hemicolectomy   Subjective Patient was seen and examined this morning.  Young African-American female. Lying down on bed.  Multiple family members at bedside. Remains n.p.o.  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 Fredricka Bonine Currently in postop ileus. N.p.o.  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  Also has liver mets which may need biopsy once clinical condition improves. Pending pathology report.  Severe protein calorie malnutrition Secondary to suspected malignancy.   Noted a plan to start TPN because of anticipated ileus  Severe anemia Chronic iron deficiency anemia Likely from chronic GI blood loss due to intra-abdominal mass Hemoglobin low at 5.3.  Ferritin level was severely low at 2. 2 units PRBC transfused. Hemoglobin at 6.4 today again.  1 more unit of PRBC transfused today. Recent Labs    09/22/22 1546 09/22/22 2100 09/23/22 0020 09/24/22 0719 09/24/22 1045 09/24/22 1048 09/25/22 0528 09/26/22 0522  HGB 5.3*  --    < > 7.6* 11.2*  9.5* 7.4* 6.4*  MCV 67.4*  --    < >  --   --  76.8* 76.1* 77.7*  VITAMINB12  --  250  --   --   --   --   --   --   FOLATE  --  7.9  --   --   --   --   --   --   FERRITIN  --  2*  --   --   --   --   --   --   TIBC  --  508*  --   --   --   --   --   --   IRON  --  5*  --   --   --   --   --   --   RETICCTPCT 2.3  --   --   --   --   --   --   --    < > = values in this interval not displayed.   Hypokalemia/hypomagnesemia Potassium and magnesium level low today. IV replacement ordered Recent Labs  Lab 09/23/22 0020 09/24/22 0528 09/24/22 1045 09/25/22 0528 09/26/22 0522  K 3.6 3.4* 3.8 3.5 3.1*  MG 1.8  --   --   --  1.5*  PHOS 3.5  --   --   --  4.2   Superficial venous thrombosis PICC line placed on right arm on 7/10.  Patient started noticing swelling on the right arm since last night.  Ultrasound duplex were obtained this morning which showed superficial venous thrombosis on  cephalic vein distally can and also around the PICC line.   Patient and family strongly wanting PICC line out.  She is currently receiving TPN through it. Defer to general surgery.  Not a candidate for anticoagulation given severe anemia.  Mobility: Encourage ambulation  Goals of care   Code Status: Full Code     DVT prophylaxis:  SCDs Start: 09/22/22 2307   Antimicrobials: Perioperative antibiotics Fluid: TPN Consultants: General surgery Family Communication: Multiple family members at bedside  Status: Inpatient Level of care:  Progressive   Patient from: Home Anticipated d/c to: Pending clinical course Needs to continue in-hospital care:  postop ileus   Diet:  Diet Order             Diet clear liquid Fluid consistency: Thin  Diet effective now                   Scheduled Meds:  Chlorhexidine Gluconate Cloth  6 each Topical Daily   gabapentin  300 mg Oral TID   insulin aspart  0-9 Units Subcutaneous Q6H   mupirocin ointment  1 Application Nasal BID   sodium chloride  flush  10-40 mL Intracatheter Q12H   thiamine (VITAMIN B1) injection  100 mg Intravenous Daily    PRN meds: acetaminophen, HYDROmorphone (DILAUDID) injection, melatonin, ondansetron (ZOFRAN) IV, mouth rinse, oxyCODONE, polyethylene glycol, prochlorperazine, sodium chloride flush   Infusions:   dextrose 5% lactated ringers 70 mL/hr at 09/25/22 2231   methocarbamol (ROBAXIN) IV 500 mg (09/26/22 1223)   potassium chloride 10 mEq (09/26/22 1335)   TPN ADULT (ION) 30 mL/hr at 09/25/22 1759   TPN ADULT (ION)      Antimicrobials: Anti-infectives (From admission, onward)    Start     Dose/Rate Route Frequency Ordered Stop   09/24/22 0800  ceFAZolin (ANCEF) IVPB 2g/100 mL premix  Status:  Discontinued        2 g 200 mL/hr over 30 Minutes Intravenous On call to O.R. 09/24/22 0712 09/24/22 1524       Nutritional status:  Body mass index is 15.49 kg/m.  Nutrition Problem: Severe Malnutrition Etiology: chronic illness Signs/Symptoms: moderate fat depletion, moderate muscle depletion     Objective: Vitals:   09/26/22 0524 09/26/22 1332  BP: 105/68 107/84  Pulse:  85  Resp:    Temp:  97.9 F (36.6 C)  SpO2:  100%    Intake/Output Summary (Last 24 hours) at 09/26/2022 1449 Last data filed at 09/26/2022 1240 Gross per 24 hour  Intake 5103.22 ml  Output 2000 ml  Net 3103.22 ml   Filed Weights   09/22/22 1541 09/22/22 2317 09/26/22 0559  Weight: 44.6 kg 46 kg 46.2 kg   Weight change:  Body mass index is 15.49 kg/m.   Physical Exam: General exam: Pleasant young African-American female. Skin: No rashes, lesions or ulcers. HEENT: Atraumatic, normocephalic, no obvious bleeding Lungs: Clear to auscultation bilaterally CVS: Regular rate and rhythm, no murmur GI/Abd soft, appropriate postop tenderness, bowel sounds sluggish CNS: Alert, awake, oriented x 3 Psychiatry: Mood appropriate Extremities: No pedal edema, no calf tenderness.  Right arm with PICC line and  swelling.  Data Review: I have personally reviewed the laboratory data and studies available.  F/u labs ordered Unresulted Labs (From admission, onward)     Start     Ordered   09/30/22 0500  Triglycerides  (Standard TPN Labs (all labs to be drawn at 0500))  Every Monday (0500),   R  Question:  Specimen collection method  Answer:  Unit=Unit collect   09/25/22 0828   09/27/22 0500  Magnesium  Tomorrow morning,   R       Question:  Specimen collection method  Answer:  IV Team=IV Team collect   09/26/22 0658   09/27/22 0500  Phosphorus  Tomorrow morning,   R       Question:  Specimen collection method  Answer:  IV Team=IV Team collect   09/26/22 0658   09/27/22 0500  Basic metabolic panel  Tomorrow morning,   R       Question:  Specimen collection method  Answer:  IV Team=IV Team collect   09/26/22 0658   09/27/22 0500  CBC  Tomorrow morning,   R       Question:  Specimen collection method  Answer:  IV Team=IV Team collect   09/26/22 0658   09/27/22 0500  Magnesium  Tomorrow morning,   R       Question:  Specimen collection method  Answer:  IV Team=IV Team collect   09/26/22 1150   09/27/22 0500  Basic metabolic panel  Tomorrow morning,   R       Question:  Specimen collection method  Answer:  IV Team=IV Team collect   09/26/22 1150   09/27/22 0500  Phosphorus  Tomorrow morning,   R       Question:  Specimen collection method  Answer:  IV Team=IV Team collect   09/26/22 1150   09/26/22 0500  Magnesium  (Standard TPN Labs (all labs to be drawn at 0500))  Every Mon,Thu (0500),   R     Question:  Specimen collection method  Answer:  Unit=Unit collect   09/25/22 0828   09/26/22 0500  Phosphorus  (Standard TPN Labs (all labs to be drawn at 0500))  Every Mon,Thu (0500),   R     Question:  Specimen collection method  Answer:  Unit=Unit collect   09/25/22 0828   09/26/22 0500  Comprehensive metabolic panel  (TPN Lab Panel)  Every Mon,Thu (0500),   R     Question:  Specimen collection  method  Answer:  Unit=Unit collect   09/25/22 0852   09/26/22 0500  Magnesium  (TPN Lab Panel)  Every Mon,Thu (0500),   R     Question:  Specimen collection method  Answer:  Unit=Unit collect   09/25/22 0852   09/26/22 0500  Phosphorus  (TPN Lab Panel)  Every Mon,Thu (0500),   R     Question:  Specimen collection method  Answer:  Unit=Unit collect   09/25/22 0852   09/26/22 0500  Triglycerides  (TPN Lab Panel)  Every Monday (0500),   R     Question:  Specimen collection method  Answer:  Unit=Unit collect   09/25/22 0852            Total time spent in review of labs and imaging, patient evaluation, formulation of plan, documentation and communication with family: 45 minutes  Signed, Lorin Glass, MD [facility] 09/26/2022

## 2022-09-26 NOTE — Progress Notes (Signed)
Notified by primary team that patient has SVT of the RUE. No evidence of DVT.   I came to patients bedside to provide update. Family also at bedside. Received permission from patient to discuss her medical care infront of family.  Will plan to have PICC team place LUE PICC line and remove RUE PICC line. In the interm would like her to continue to receive TPN. Patient in agreement with this plan.   Patient also stated that they were notified by primary team that her path shows cancer. 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 Again final pathology is still pending. This information was relayed to the patient while I was in the room.   For further workup I have ordered IR bx of the liver lesion, CT chest to r/o other mets and a CEA level.   Questions were repeatedly welcomed and answered to the best of my ability.  After I left the room I called Dr. Truett Perna of Oncology for consultation. He will plan to see the patient tonight.   Jacinto Halim, PA-C

## 2022-09-27 ENCOUNTER — Other Ambulatory Visit: Payer: Self-pay | Admitting: *Deleted

## 2022-09-27 ENCOUNTER — Inpatient Hospital Stay (HOSPITAL_COMMUNITY): Payer: Medicaid Other

## 2022-09-27 ENCOUNTER — Encounter: Payer: Self-pay | Admitting: *Deleted

## 2022-09-27 DIAGNOSIS — K6389 Other specified diseases of intestine: Secondary | ICD-10-CM

## 2022-09-27 DIAGNOSIS — K561 Intussusception: Secondary | ICD-10-CM | POA: Diagnosis not present

## 2022-09-27 LAB — TYPE AND SCREEN
ABO/RH(D): B POS
Antibody Screen: NEGATIVE
Unit division: 0
Unit division: 0
Unit division: 0
Unit division: 0
Unit division: 0
Unit division: 0
Unit division: 0

## 2022-09-27 LAB — BPAM RBC
Blood Product Expiration Date: 202408012359
Blood Product Expiration Date: 202408012359
Blood Product Expiration Date: 202408052359
Blood Product Expiration Date: 202408062359
Blood Product Expiration Date: 202408062359
Blood Product Expiration Date: 202408072359
Blood Product Expiration Date: 202408072359
ISSUE DATE / TIME: 202407081209
ISSUE DATE / TIME: 202407081435
ISSUE DATE / TIME: 202407082346
ISSUE DATE / TIME: 202407112328
ISSUE DATE / TIME: 202407120910
ISSUE DATE / TIME: 202407120957
ISSUE DATE / TIME: 202407120957
Unit Type and Rh: 7300
Unit Type and Rh: 7300
Unit Type and Rh: 7300
Unit Type and Rh: 7300
Unit Type and Rh: 7300
Unit Type and Rh: 7300
Unit Type and Rh: 7300

## 2022-09-27 LAB — CBC
HCT: 29.9 % — ABNORMAL LOW (ref 36.0–46.0)
Hemoglobin: 9.2 g/dL — ABNORMAL LOW (ref 12.0–15.0)
MCH: 24.7 pg — ABNORMAL LOW (ref 26.0–34.0)
MCHC: 30.8 g/dL (ref 30.0–36.0)
MCV: 80.2 fL (ref 80.0–100.0)
Platelets: 298 10*3/uL (ref 150–400)
RBC: 3.73 MIL/uL — ABNORMAL LOW (ref 3.87–5.11)
RDW: 22.5 % — ABNORMAL HIGH (ref 11.5–15.5)
WBC: 9.4 10*3/uL (ref 4.0–10.5)
nRBC: 0 % (ref 0.0–0.2)

## 2022-09-27 LAB — BASIC METABOLIC PANEL
Anion gap: 7 (ref 5–15)
BUN: <5 mg/dL — ABNORMAL LOW (ref 6–20)
CO2: 23 mmol/L (ref 22–32)
Calcium: 8.3 mg/dL — ABNORMAL LOW (ref 8.9–10.3)
Chloride: 105 mmol/L (ref 98–111)
Creatinine, Ser: 0.43 mg/dL — ABNORMAL LOW (ref 0.44–1.00)
GFR, Estimated: >60 mL/min (ref >60)
Glucose, Bld: 108 mg/dL — ABNORMAL HIGH (ref 70–99)
Potassium: 3.8 mmol/L (ref 3.5–5.1)
Sodium: 135 mmol/L (ref 135–145)

## 2022-09-27 LAB — PROTIME-INR
INR: 1.1 (ref 0.8–1.2)
Prothrombin Time: 14.5 seconds (ref 11.4–15.2)

## 2022-09-27 LAB — MAGNESIUM: Magnesium: 2 mg/dL (ref 1.7–2.4)

## 2022-09-27 LAB — SURGICAL PATHOLOGY

## 2022-09-27 LAB — GLUCOSE, CAPILLARY
Glucose-Capillary: 127 mg/dL — ABNORMAL HIGH (ref 70–99)
Glucose-Capillary: 129 mg/dL — ABNORMAL HIGH (ref 70–99)
Glucose-Capillary: 121 mg/dL — ABNORMAL HIGH (ref 70–99)

## 2022-09-27 LAB — PHOSPHORUS: Phosphorus: 3.9 mg/dL (ref 2.5–4.6)

## 2022-09-27 MED ORDER — DEXTROSE IN LACTATED RINGERS 5 % IV SOLN: INTRAVENOUS | Status: AC

## 2022-09-27 MED ORDER — METOCLOPRAMIDE HCL 5 MG/ML IJ SOLN
10.0000 mg | Freq: Four times a day (QID) | INTRAMUSCULAR | Status: DC
  Administered 2022-09-27 – 2022-10-01 (×16): 10 mg via INTRAVENOUS
  Filled 2022-09-27 (×16): qty 2

## 2022-09-27 MED ORDER — BISACODYL 10 MG RE SUPP: 10.0000 mg | Freq: Every day | RECTAL | Status: DC | PRN

## 2022-09-27 MED ORDER — ACETAMINOPHEN 10 MG/ML IV SOLN: 1000.0000 mg | Freq: Four times a day (QID) | INTRAVENOUS | Status: AC | PRN

## 2022-09-27 MED ORDER — TRAVASOL 10 % IV SOLN
INTRAVENOUS | Status: AC
  Filled 2022-09-27: qty 686.4

## 2022-09-27 MED ORDER — IOHEXOL 300 MG/ML  SOLN
75.0000 mL | Freq: Once | INTRAMUSCULAR | Status: AC | PRN
  Administered 2022-09-27: 75 mL via INTRAVENOUS

## 2022-09-27 NOTE — Progress Notes (Addendum)
PHARMACY - TOTAL PARENTERAL NUTRITION CONSULT NOTE   Indication:  severe malnutrition, ileus anticipated s/p surgery - Small bowel mass with ileocolonic intussusception-status post exploratory laparotomy with right hemicolectomy 09/24/2022.   Patient Measurements: Height: 5\' 8"  (172.7 cm) Weight: 52.3 kg (115 lb 4.8 oz) IBW/kg (Calculated) : 63.9 TPN AdjBW (KG): 46 Body mass index is 17.53 kg/m. Usual Weight:   Assessment: Small bowel mass with ileocolonic intussusception-status post exploratory laparotomy with right hemicolectomy 09/24/2022. 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 - PMH: underweight (lost 40lbs since 8/23)  Glucose / Insulin:  - No h/o DM.  - CBG 88-127 ( Goal <150)  Electrolytes:  - K+ up to 3.8.  - CorrCa 9.5 (albumin 2.5)  - Magnesium up to 2 Renal:  - Scr <1 Hepatic:  - LFT's WNL Intake / Output; MIVF:  - MIVF: D5LR at 72ml/hr - 7/11 PM: 120 po intake noted,  - UOP - LBM 09/24/22  GI Imaging: 7/7: CT angio: Small bowel mass with ileocolonic intussusception, liver lesion suspicious for mets.  GI Surgeries / Procedures:  7/9: exploratory laparotomy with right hemicolectomy  Central access: Place PICC 7/10 TPN start date: 09/25/22  Nutritional Goals: Change goal to 47ml/hr (less fluid) to provide 1827 kcal and 87g protein  RD Assessment: Estimated Nutritional Needs:  ( 7/10)  Kcal:  1700-1900  Protein:  85-95g  Fluid:  1.9L/day   Current Nutrition:  NPO  Plan:  Increase TPN to 55 ml/hr and monitor for refeeding (goal 66ml/hr)  Electrolytes in TPN:  Inc Na 71mEq/L  Inc K 3mEq/L  Inc Ca 76mEq/L,  Inc Mg 54mEq/L Phos 62mmol/L. Cl:Ac 1:1 Add standard MVI and trace elements to TPN Initiate Sensitive q6h SSI and adjust as needed  Decrease D5LR to 49ml/hr with next bag BMP, magnesium, phosphorus with AM labs  Monitor TPN labs on Mon/Thurs,  AND prn  Makinlee Awwad S. Merilynn Finland, PharmD, BCPS Clinical Staff  Pharmacist [email or web address] 09/27/2022 7:36 AM

## 2022-09-27 NOTE — Progress Notes (Signed)
Referral for hem/onc entered

## 2022-09-27 NOTE — Progress Notes (Addendum)
3 Days Post-Op  Subjective: CC: Unfortunately some nausea, vomiting and crampy upper abdominal pain overnight and this morning.  No flatus or bowel movement yet.  Events of yesterday noted with superficial thrombus surrounding her PICC line.  Objective: Vital signs in last 24 hours: Temp:  [97.9 F (36.6 C)-98.6 F (37 C)] 98.6 F (37 C) (07/12 0309) Pulse Rate:  [85-91] 91 (07/12 0309) Resp:  [14-20] 14 (07/11 2349) BP: (107-116)/(70-84) 110/70 (07/12 0500) SpO2:  [100 %] 100 % (07/12 0309) Weight:  [52.3 kg] 52.3 kg (07/12 0500) Last BM Date : 09/24/22  Intake/Output from previous day: 07/11 0701 - 07/12 0700 In: 2511.5 [P.O.:120; I.V.:1274; Blood:315; IV Piggyback:487.5] Out: 1300 [Urine:1300] Intake/Output this shift: No intake/output data recorded.  PE: Gen:  Alert, NAD, pleasant Abd: Soft, mild distension, appropriately tender. Incision is clean, dry and intact  Lab Results:  Recent Labs    09/26/22 0522 09/27/22 0508  WBC 6.9 9.4  HGB 6.4* 9.2*  HCT 21.3* 29.9*  PLT 248 298   BMET Recent Labs    09/26/22 0522 09/27/22 0508  NA 133* 135  K 3.1* 3.8  CL 104 105  CO2 23 23  GLUCOSE 100* 108*  BUN 5* <5*  CREATININE 0.52 0.43*  CALCIUM 7.6* 8.3*   PT/INR Recent Labs    09/27/22 0508  LABPROT 14.5  INR 1.1   CMP     Component Value Date/Time   NA 135 09/27/2022 0508   K 3.8 09/27/2022 0508   CL 105 09/27/2022 0508   CO2 23 09/27/2022 0508   GLUCOSE 108 (H) 09/27/2022 0508   BUN <5 (L) 09/27/2022 0508   CREATININE 0.43 (L) 09/27/2022 0508   CALCIUM 8.3 (L) 09/27/2022 0508   PROT 5.3 (L) 09/26/2022 0522   ALBUMIN 2.5 (L) 09/26/2022 0522   AST 19 09/26/2022 0522   ALT 12 09/26/2022 0522   ALKPHOS 28 (L) 09/26/2022 0522   BILITOT 0.5 09/26/2022 0522   GFRNONAA >60 09/27/2022 0508   GFRAA >60 08/02/2014 0337   Lipase     Component Value Date/Time   LIPASE 23 09/22/2022 1546    Studies/Results: VAS [place] UPPER EXTREMITY VENOUS  DUPLEX  Result Date: 09/26/2022 UPPER VENOUS STUDY  Patient Name:  ASALIA JESIONOWSKI  Date of Exam:   09/26/2022 Medical Rec #: [id number]        Accession #:    [id number] Date of Birth: 05-08-1985         Patient Gender: F Patient Age:   37 years Exam Location:  [facility] Procedure:      VAS [place] UPPER EXTREMITY VENOUS DUPLEX Referring Phys: ABIGAIL CHAVEZ --------------------------------------------------------------------------------  Indications: recent iv and picc line placement, pain, swelling Comparison Study: No previous study. Performing Technologist: McKayla Maag RVT, [place]  Examination Guidelines: A complete evaluation includes B-mode imaging, spectral Doppler, color Doppler, and power Doppler as needed of all accessible portions of each vessel. Bilateral testing is considered an integral part of a complete examination. Limited examinations for reoccurring indications may be performed as noted.  Right Findings: +----------+------------+---------+-----------+----------+-------+ RIGHT     CompressiblePhasicitySpontaneousPropertiesSummary +----------+------------+---------+-----------+----------+-------+ IJV           Full       Yes       Yes                      +----------+------------+---------+-----------+----------+-------+ Subclavian    Full       Yes  Yes                      +----------+------------+---------+-----------+----------+-------+ Axillary      Full       Yes       Yes                      +----------+------------+---------+-----------+----------+-------+ Brachial      Full       Yes       Yes                      +----------+------------+---------+-----------+----------+-------+ Radial        Full                                          +----------+------------+---------+-----------+----------+-------+ Ulnar         Full                                          +----------+------------+---------+-----------+----------+-------+  Cephalic      None       No        No                Acute  +----------+------------+---------+-----------+----------+-------+ Basilic       None       No        No                Acute  +----------+------------+---------+-----------+----------+-------+  Left Findings: +----------+------------+---------+-----------+----------+-------+ LEFT      CompressiblePhasicitySpontaneousPropertiesSummary +----------+------------+---------+-----------+----------+-------+ Subclavian    Full       Yes       Yes                      +----------+------------+---------+-----------+----------+-------+  Summary:  Right: No evidence of deep vein thrombosis in the upper extremity. Findings consistent with acute superficial vein thrombosis involving the right basilic vein, mid upper arm to [employer] fossa, and right cephalic vein, proximal upper arm to mid forearm. The superficial thrombus in the basilic vein is noted to be around Picc Line.  Left: No evidence of thrombosis in the subclavian.  *See table(s) above for measurements and observations.  Diagnosing physician: Sherald Hess MD Electronically signed by Sherald Hess MD on 09/26/2022 at 3:43:23 PM.    Final    [place] EKG SITE RITE  Result Date: 09/26/2022 If Site Rite image not attached, placement could not be confirmed due to current cardiac rhythm.   Anti-infectives: Anti-infectives (From admission, onward)    Start     Dose/Rate Route Frequency Ordered Stop   09/24/22 0800  ceFAZolin (ANCEF) IVPB 2g/100 mL premix  Status:  Discontinued        2 g 200 mL/hr over 30 Minutes Intravenous On call to O.R. 09/24/22 0712 09/24/22 1524        Assessment/Plan POD 3 s/p exploratory laparotomy with right hemicolectomy for Small bowel intussusception with associated mass, partial obstruction and GI bleed on 09/24/2022 by Dr. 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  Patient is aware.  Appreciate Dr. Kalman Drape care- she will have outpatient follow-up with oncology.  CT chest pending this morning, CEA level pending. Will hold off on liver biopsy at this point- once final path is available will plan MRI to further characterize this lesion and evaluate for others; this will be directed outpatient by the oncology team.  - Expected postop ileus-  n.p.o., await return of bowel function.  If ongoing nausea and emesis today would recommend NG tube.  Currently afebrile, no tachycardia, no leukocytosis but given malnutrition she may not mount much of a SIRS response if she does have something like a leak, we will monitor closely. - Mobilize - Pulm toilet ABL anemia - Required 3U PRBC pre-op and 1 postop. Hgb 9.2 this am.  Continue to monitor.  Severe protein calorie malnutrition - Cont TPN Hypokalemia -resolved, continue to monitor Hypomagnesemia -resolved, continue to monitor Superficial venous thrombus surrounding PICC line-she has very mild swelling distal to the PICC; my inclination would be to continue the PICC line and maximize getting TPN on board as this is much more beneficial than any risk associated with this thrombus, could consider starting heparin in the next day or 2 if hemoglobin remains stable.  Patient is wanting the PICC line out but we will continue talking her about this.  Per PICC nurse very small veins and I anticipate removing the PICC line to the other side will generate a similar issue on that side as well. FEN - NPO/sips. IVF per TR[facility]. PICC/TPN VTE - SCDs, chem ppx on hold for abl anemia  ID - Peri-op abx. None currently.  Foley - None, spont void   LOS: 4 days    Berna Bue , MD  [facility] 09/27/2022, 10:01 AM Please see Amion for pager number during day hours 7:00am-4:30pm

## 2022-09-27 NOTE — Plan of Care (Signed)
  Problem: Clinical Measurements: Goal: Respiratory complications will improve Outcome: Progressing   Problem: Coping: Goal: Level of anxiety will decrease Outcome: Progressing   Problem: Pain Managment: Goal: General experience of comfort will improve Outcome: Progressing   Problem: Safety: Goal: Ability to remain free from injury will improve Outcome: Progressing   

## 2022-09-27 NOTE — Progress Notes (Signed)
Secure chat with Dr. Fredricka Bonine with [facility] regarding PICC order. MD ok with cancelling the PICC order, as pt currently has a functioning PICC placed on 7/11.

## 2022-09-27 NOTE — Plan of Care (Signed)
  Problem: Education: Goal: Knowledge of General Education information will improve Description: Including pain rating scale, medication(s)/side effects and non-pharmacologic comfort measures Outcome: Progressing   Problem: Coping: Goal: Level of anxiety will decrease Outcome: Progressing   Problem: Pain Managment: Goal: General experience of comfort will improve Outcome: Progressing   

## 2022-09-27 NOTE — Progress Notes (Signed)
PROGRESS NOTE  Meghan Holland  DOB: [birth year]  PCP: Patient, No Pcp Per GEX:[id number]  DOA: 09/22/2022  LOS: 4 days  Hospital Day: 6  Brief narrative: Meghan Holland is a 37 y.o. female with PMH significant for iron deficiency anemia 7/7, patient presented to the ED with complaint of abdominal cramping pain radiating to back, black stool, loss of appetite, 40 pounds weight loss in a year. In the ED, she had a low hemoglobin of 5.3, FOBT positive CTA abdomen/pelvis showed small bowel intussusception and hyperenhancing mass lesion in the central abdomen measuring 4 x 3.7 cm.   Multiple air-fluid bubbles were noted in small bowel, SBO.   She was also found to have hypoenhancing lesion in the right lobe of liver, suspicious for metastasis. General surgery was consulted. 7/9, patient underwent ex lap with right hemicolectomy   Subjective Patient was seen and examined this morning.   Lying on bed.   Her sister as well as other family numbers were at bedside.   Earlier today, patient complained nausea, vomiting and crampy abdominal pain. CT abdomen was repeated by general surgery.  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 Fredricka Bonine Preliminary biopsy report showed neuroendocrine tumor with nodal involvement.  Oncology consult appreciated.  Outpatient care planned.  Postop ileus Currently in postop ileus.  Receiving TPN through PICC line in the right CT abdomen today suggested possible bowel obstruction.  Deferred to general surgery.  Severe protein calorie malnutrition Secondary to suspected malignancy.   Currently on TPN.  Severe anemia Chronic iron deficiency anemia Likely from chronic GI blood loss due to intra-abdominal mass Hemoglobin low at 5.3.  Ferritin level was severely low at 2. 3 units PRBC transfused so far. Hemoglobin at 9.2 today.   Recent Labs     09/22/22 1546 09/22/22 2100 09/23/22 0020 09/24/22 1045 09/24/22 1048 09/25/22 0528 09/26/22 0522 09/27/22 0508  HGB 5.3*  --    < > 11.2* 9.5* 7.4* 6.4* 9.2*  MCV 67.4*  --    < >  --  76.8* 76.1* 77.7* 80.2  VITAMINB12  --  250  --   --   --   --   --   --   FOLATE  --  7.9  --   --   --   --   --   --   FERRITIN  --  2*  --   --   --   --   --   --   TIBC  --  508*  --   --   --   --   --   --   IRON  --  5*  --   --   --   --   --   --   RETICCTPCT 2.3  --   --   --   --   --   --   --    < > = values in this interval not displayed.   Hypokalemia/hypomagnesemia Potassium and magnesium level improved with replacement. Recent Labs  Lab 09/23/22 0020 09/24/22 0528 09/24/22 1045 09/25/22 0528 09/26/22 0522 09/27/22 0508  K 3.6 3.4* 3.8 3.5 3.1* 3.8  MG 1.8  --   --   --  1.5* 2.0  PHOS 3.5  --   --   --  4.2 3.9   Superficial venous thrombosis PICC line placed on right arm on 7/10.  Patient started noticing swelling on the right arm since last night.  Ultrasound duplex were obtained this morning which showed superficial venous thrombosis on cephalic vein distally can and also around the PICC line.   Findings were discussed with general surgery team as well.  Patient is at high risk of clotting at any IV access site because of underlying malignancy..  At this time plan is to continue the existing PICC line and monitor. Not a candidate for anticoagulation given severe anemia.  Mobility: Encourage ambulation  Goals of care   Code Status: Full Code     DVT prophylaxis:  SCDs Start: 09/22/22 2307   Antimicrobials: Perioperative antibiotics Fluid: TPN Consultants: General surgery Family Communication: Multiple family members at bedside  Status: Inpatient Level of care:  Progressive   Patient from: Home Anticipated d/c to: Pending clinical course Needs to continue in-hospital care:  postop ileus   Diet:  Diet Order             Diet NPO time specified Except  for: Sips with Meds  Diet effective midnight                   Scheduled Meds:  Chlorhexidine Gluconate Cloth  6 each Topical Daily   insulin aspart  0-9 Units Subcutaneous Q6H   metoCLOPramide (REGLAN) injection  10 mg Intravenous Q6H   mupirocin ointment  1 Application Nasal BID   sodium chloride flush  10-40 mL Intracatheter Q12H   thiamine (VITAMIN B1) injection  100 mg Intravenous Daily    PRN meds: acetaminophen, bisacodyl, HYDROmorphone (DILAUDID) injection, melatonin, ondansetron (ZOFRAN) IV, mouth rinse, oxyCODONE, polyethylene glycol, prochlorperazine, sodium chloride flush   Infusions:   acetaminophen     dextrose 5% lactated ringers 70 mL/hr at 09/27/22 0251   dextrose 5% lactated ringers     methocarbamol (ROBAXIN) IV 500 mg (09/27/22 1026)   TPN ADULT (ION) 30 mL/hr at 09/26/22 1741   TPN ADULT (ION)      Antimicrobials: Anti-infectives (From admission, onward)    Start     Dose/Rate Route Frequency Ordered Stop   09/24/22 0800  ceFAZolin (ANCEF) IVPB 2g/100 mL premix  Status:  Discontinued        2 g 200 mL/hr over 30 Minutes Intravenous On call to O.R. 09/24/22 0712 09/24/22 1524       Nutritional status:  Body mass index is 17.53 kg/m.  Nutrition Problem: Severe Malnutrition Etiology: chronic illness Signs/Symptoms: moderate fat depletion, moderate muscle depletion, energy intake < or equal to 75% for > or equal to 1 month, percent weight loss     Objective: Vitals:   09/27/22 0500 09/27/22 1209  BP: 110/70 101/78  Pulse:  87  Resp:  16  Temp:  98.3 F (36.8 C)  SpO2:  100%    Intake/Output Summary (Last 24 hours) at 09/27/2022 1447 Last data filed at 09/27/2022 0600 Gross per 24 hour  Intake 2511.54 ml  Output --  Net 2511.54 ml   Filed Weights   09/22/22 2317 09/26/22 0559 09/27/22 0500  Weight: 46 kg 46.2 kg 52.3 kg   Weight change: 6.1 kg Body mass index is 17.53 kg/m.   Physical Exam: General exam: Pleasant young  African-American female.  Thin built. Skin: No rashes, lesions or ulcers. HEENT: Atraumatic, normocephalic, no obvious bleeding Lungs: Clear to auscultation bilaterally CVS: Regular rate and rhythm, no murmur GI/Abd soft, appropriate postop tenderness, bowel sounds sluggish CNS: Alert, awake, oriented x 3 Psychiatry: Sad affect Extremities:  No pedal edema, no calf tenderness.  Right arm with PICC line and swelling.  Data Review: I have personally reviewed the laboratory data and studies available.  F/u labs ordered Unresulted Labs (From admission, onward)     Start     Ordered   09/28/22 0500  Basic metabolic panel  Tomorrow morning,   R       Question:  Specimen collection method  Answer:  IV Team=IV Team collect   09/27/22 0755   09/28/22 0500  Magnesium  Tomorrow morning,   R       Question:  Specimen collection method  Answer:  IV Team=IV Team collect   09/27/22 0755   09/28/22 0500  Phosphorus  Tomorrow morning,   R       Question:  Specimen collection method  Answer:  IV Team=IV Team collect   09/27/22 0755   09/28/22 0500  CBC  Tomorrow morning,   R       Question:  Specimen collection method  Answer:  IV Team=IV Team collect   09/27/22 1012   09/27/22 0500  CEA  Tomorrow morning,   R       Question:  Specimen collection method  Answer:  IV Team=IV Team collect   09/26/22 1522   09/26/22 0500  Comprehensive metabolic panel  (TPN Lab Panel)  Every Mon,Thu (0500),   R     Question:  Specimen collection method  Answer:  Unit=Unit collect   09/25/22 0852   09/26/22 0500  Magnesium  (TPN Lab Panel)  Every Mon,Thu (0500),   R     Question:  Specimen collection method  Answer:  Unit=Unit collect   09/25/22 0852   09/26/22 0500  Phosphorus  (TPN Lab Panel)  Every Mon,Thu (0500),   R     Question:  Specimen collection method  Answer:  Unit=Unit collect   09/25/22 0852   09/26/22 0500  Triglycerides  (TPN Lab Panel)  Every Monday (0500),   R     Question:  Specimen collection  method  Answer:  Unit=Unit collect   09/25/22 0852   Pending  Hemoglobin and hematocrit, blood  Once,   R       Question:  Specimen collection method  Answer:  IV Team=IV Team collect   Pending            Total time spent in review of labs and imaging, patient evaluation, formulation of plan, documentation and communication with family: 45 minutes  Signed, Lorin Glass, MD [facility] 09/27/2022

## 2022-09-27 NOTE — Progress Notes (Signed)
Oncology Discharge Planning Note  [facility] Address: [address], [place], [place] [address] Hours of Operation:  Lewayne Bunting, Monday - Friday  Clinic Contact Information:  [phone]  Oncology Care Team: Medical Oncologist:  Truett Perna  Patient Details: Name:  Meghan Holland, Meghan Holland MRN:   [id number] DOB:   1986/03/07 Reason for Current Admission: @PPROB @  Discharge Planning Narrative: Notification of admission received by inpatient team for [facility].  Discharge follow-up appointments for oncology are current and available on the AVS and MyChart.   Upon discharge from the hospital, hematology/oncology's post discharge plan of care for the outpatient setting is: July 24,2024 at 2:15 [facility] [address] [place], [place] [address]  [phone]    Meghan Holland will be called within two business days after discharge to review hematology/oncology's plan of care for full understanding.    Outpatient Oncology Specific Care Only: Oncology appointment transportation needs addressed?:  no Oncology medication management for symptom management addressed?:  no Chemo Alert Card reviewed?:  not applicable Immunotherapy Alert Card reviewed?:  not applicable

## 2022-09-28 DIAGNOSIS — K561 Intussusception: Secondary | ICD-10-CM | POA: Diagnosis not present

## 2022-09-28 LAB — BASIC METABOLIC PANEL
Anion gap: 5 (ref 5–15)
BUN: 8 mg/dL (ref 6–20)
CO2: 25 mmol/L (ref 22–32)
Calcium: 8 mg/dL — ABNORMAL LOW (ref 8.9–10.3)
Chloride: 104 mmol/L (ref 98–111)
Creatinine, Ser: 0.49 mg/dL (ref 0.44–1.00)
GFR, Estimated: >60 mL/min (ref >60)
Glucose, Bld: 112 mg/dL — ABNORMAL HIGH (ref 70–99)
Potassium: 3.8 mmol/L (ref 3.5–5.1)
Sodium: 134 mmol/L — ABNORMAL LOW (ref 135–145)

## 2022-09-28 LAB — CBC
HCT: 27.6 % — ABNORMAL LOW (ref 36.0–46.0)
Hemoglobin: 8.5 g/dL — ABNORMAL LOW (ref 12.0–15.0)
MCH: 25 pg — ABNORMAL LOW (ref 26.0–34.0)
MCHC: 30.8 g/dL (ref 30.0–36.0)
MCV: 81.2 fL (ref 80.0–100.0)
Platelets: 283 10*3/uL (ref 150–400)
RBC: 3.4 MIL/uL — ABNORMAL LOW (ref 3.87–5.11)
RDW: 23.2 % — ABNORMAL HIGH (ref 11.5–15.5)
WBC: 7.6 10*3/uL (ref 4.0–10.5)
nRBC: 0 % (ref 0.0–0.2)

## 2022-09-28 LAB — MAGNESIUM: Magnesium: 2 mg/dL (ref 1.7–2.4)

## 2022-09-28 LAB — GLUCOSE, CAPILLARY
Glucose-Capillary: 100 mg/dL — ABNORMAL HIGH (ref 70–99)
Glucose-Capillary: 100 mg/dL — ABNORMAL HIGH (ref 70–99)
Glucose-Capillary: 109 mg/dL — ABNORMAL HIGH (ref 70–99)
Glucose-Capillary: 120 mg/dL — ABNORMAL HIGH (ref 70–99)
Glucose-Capillary: 98 mg/dL (ref 70–99)

## 2022-09-28 LAB — PHOSPHORUS: Phosphorus: 4.7 mg/dL — ABNORMAL HIGH (ref 2.5–4.6)

## 2022-09-28 MED ORDER — DEXTROSE IN LACTATED RINGERS 5 % IV SOLN: INTRAVENOUS | Status: DC

## 2022-09-28 MED ORDER — TRAVASOL 10 % IV SOLN
INTRAVENOUS | Status: AC
  Filled 2022-09-28: qty 873.6

## 2022-09-28 NOTE — Plan of Care (Signed)
  Problem: Nutrition: Goal: Adequate nutrition will be maintained Outcome: Progressing   Problem: Coping: Goal: Level of anxiety will decrease Outcome: Progressing   Problem: Pain Managment: Goal: General experience of comfort will improve Outcome: Progressing   Problem: Safety: Goal: Ability to remain free from injury will improve Outcome: Progressing   

## 2022-09-28 NOTE — Progress Notes (Signed)
PHARMACY - TOTAL PARENTERAL NUTRITION CONSULT NOTE   Indication:  severe malnutrition, ileus anticipated s/p surgery  Patient Measurements: Height: 5\' 8"  (172.7 cm) Weight: 52.1 kg (114 lb 13.8 oz) IBW/kg (Calculated) : 63.9 TPN AdjBW (KG): 46 Body mass index is 17.46 kg/m. Usual Weight:   Assessment: - Small bowel mass with ileocolonic intussusception, mass, partial obstruction, and GIB-status post exploratory laparotomy with right hemicolectomy 09/24/2022. 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 - PMH: underweight (lost 40lbs since 8/23)  Glucose / Insulin:  - No h/o DM. A1C 4.7 - CBG 98-129 ( Goal <150), No SSI given. Electrolytes:  - K+ 3.8. Mg 2 - CorrCa 9.2 (albumin 2.5)  - Phos 4.7 elevated - Magnesium up to 2 Renal:  - Scr <1 Hepatic:  - LFT's WNL Intake / Output; MIVF:  - MIVF: D5LR at 40ml/hr - PO: 60 po intake noted,  - UOP: unmeasured - LBM 7/13  GI Imaging: 7/7: CT angio: Small bowel mass with ileocolonic intussusception, liver lesion suspicious for mets.  GI Surgeries / Procedures:  7/9: exploratory laparotomy with right hemicolectomy  Central access: Place PICC 7/10--superficial thrombus surrounding her PICC line.   TPN start date: 09/25/22  Nutritional Goals: Goal 54ml/hr (less fluid) to provide 1827 kcal and 87g protein  RD Assessment: Estimated Nutritional Needs:  ( 7/10)  Kcal:  1700-1900  Protein:  85-95g  Fluid:  1.9L/day   Current Nutrition:  NPO  Plan:  Increase TPN to goal 70 ml/hr and monitor for refeeding. (1680 ml fluid) Electrolytes in TPN:  Inc Na 76mEq/L  Inc K 37mEq/L  Inc Ca 80mEq/L,  Inc Mg 41mEq/L Phos 37mmol/L. Cl:Ac 1:1 Add standard MVI and trace elements to TPN Initiate Sensitive q6h SSI and adjust as needed  Decrease D5LR to kvo with next bag BMP, magnesium, phosphorus with AM labs  Monitor TPN labs on Mon/Thurs,  AND prn Possible CLD to  be added today?   Meghan Holland, PharmD, BCPS Clinical Staff Pharmacist [email or web address] 09/28/2022 9:50 AM

## 2022-09-28 NOTE — Progress Notes (Signed)
4 Days Post-Op   Subjective/Chief Complaint: Patient had a BM last night No further bloating or emesis Pain controlled   Objective: Vital signs in last 24 hours: Temp:  [98.2 F (36.8 C)-98.3 F (36.8 C)] 98.2 F (36.8 C) (07/13 1610) Pulse Rate:  [74-87] 74 (07/13 0642) Resp:  [16] 16 (07/12 1209) BP: (99-107)/(72-78) 99/72 (07/13 0642) SpO2:  [89 %-100 %] 89 % (07/13 0642) Weight:  [52.1 kg] 52.1 kg (07/13 0500) Last BM Date : 09/24/22  Intake/Output from previous day: 07/12 0701 - 07/13 0700 In: 1651 [P.O.:60; I.V.:1426; IV Piggyback:165] Out: -  Intake/Output this shift: No intake/output data recorded.  Exam: Awake and alert Comfortable Abdomen soft, non-distended, minimally tender Dressing dry and intact  Lab Results:  Recent Labs    09/27/22 0508 09/28/22 0351  WBC 9.4 7.6  HGB 9.2* 8.5*  HCT 29.9* 27.6*  PLT 298 283   BMET Recent Labs    09/27/22 0508 09/28/22 0351  NA 135 134*  K 3.8 3.8  CL 105 104  CO2 23 25  GLUCOSE 108* 112*  BUN <5* 8  CREATININE 0.43* 0.49  CALCIUM 8.3* 8.0*   PT/INR Recent Labs    09/27/22 0508  LABPROT 14.5  INR 1.1   ABG No results for input(s): "PHART", "HCO3" in the last 72 hours.  Invalid input(s): "PCO2", "PO2"  Studies/Results: CT CHEST W CONTRAST  Result Date: 09/27/2022 CLINICAL DATA:  Small bowel neoplasm, evaluate for metastatic disease EXAM: CT CHEST WITH CONTRAST TECHNIQUE: Multidetector CT imaging of the chest was performed during intravenous contrast administration. RADIATION DOSE REDUCTION: This exam was performed according to the departmental dose-optimization program which includes automated exposure control, adjustment of the mA and/or kV according to patient size and/or use of iterative reconstruction technique. CONTRAST:  75mL OMNIPAQUE IOHEXOL 300 MG/ML  SOLN COMPARISON:  CT done on 09/22/2022 FINDINGS: Cardiovascular: No acute findings are seen. Mediastinum/Nodes: No significant  lymphadenopathy is seen. Lungs/Pleura: There is no focal pulmonary consolidation. No discrete lung nodules are seen. Small bilateral pleural effusions are seen. There is no pneumothorax. Upper Abdomen: Pneumoperitoneum is seen in upper abdomen. This may be related to recent surgery and abdomen. Another possibility would be bowel perforation. There is 2.8 cm low-density lesion in the right lobe of liver. Small to moderate ascites is seen. There is dilation of small-bowel loops. Musculoskeletal: No acute findings are seen. IMPRESSION: There are no nodules or infiltrates in the lung fields. No significant lymphadenopathy is seen. Small bilateral pleural effusions. There is 2.8 cm low-density in the right lobe of liver suggesting possible metastatic disease. The dilation of small-bowel loops suggesting small bowel obstruction. Ascites. Pneumoperitoneum in the upper abdomen may be related to recent abdominal surgery. Another possibility would be bowel perforation. These results will be called to the ordering clinician or representative by the Radiologist Assistant, and communication documented in the PACS or [employer]. Electronically Signed   By: Ernie Avena M.D.   On: 09/27/2022 13:11   VAS [place] UPPER EXTREMITY VENOUS DUPLEX  Result Date: 09/26/2022 UPPER VENOUS STUDY  Patient Name:  Meghan Holland  Date of Exam:   09/26/2022 Medical Rec #: [id number]        Accession #:    [id number] Date of Birth: February 11, 1986         Patient Gender: F Patient Age:   37 years Exam Location:  [facility] Procedure:      VAS [place] UPPER EXTREMITY VENOUS DUPLEX Referring Phys: ABIGAIL CHAVEZ --------------------------------------------------------------------------------  Indications: recent iv and picc line placement, pain, swelling Comparison Study: No previous study. Performing Technologist: McKayla Maag RVT, VT  Examination Guidelines: A complete evaluation includes B-mode imaging, spectral Doppler, color Doppler,  and power Doppler as needed of all accessible portions of each vessel. Bilateral testing is considered an integral part of a complete examination. Limited examinations for reoccurring indications may be performed as noted.  Right Findings: +----------+------------+---------+-----------+----------+-------+ RIGHT     CompressiblePhasicitySpontaneousPropertiesSummary +----------+------------+---------+-----------+----------+-------+ IJV           Full       Yes       Yes                      +----------+------------+---------+-----------+----------+-------+ Subclavian    Full       Yes       Yes                      +----------+------------+---------+-----------+----------+-------+ Axillary      Full       Yes       Yes                      +----------+------------+---------+-----------+----------+-------+ Brachial      Full       Yes       Yes                      +----------+------------+---------+-----------+----------+-------+ Radial        Full                                          +----------+------------+---------+-----------+----------+-------+ Ulnar         Full                                          +----------+------------+---------+-----------+----------+-------+ Cephalic      None       No        No                Acute  +----------+------------+---------+-----------+----------+-------+ Basilic       None       No        No                Acute  +----------+------------+---------+-----------+----------+-------+  Left Findings: +----------+------------+---------+-----------+----------+-------+ LEFT      CompressiblePhasicitySpontaneousPropertiesSummary +----------+------------+---------+-----------+----------+-------+ Subclavian    Full       Yes       Yes                      +----------+------------+---------+-----------+----------+-------+  Summary:  Right: No evidence of deep vein thrombosis in the upper extremity.  Findings consistent with acute superficial vein thrombosis involving the right basilic vein, mid upper arm to [facility] fossa, and right cephalic vein, proximal upper arm to mid forearm. The superficial thrombus in the basilic vein is noted to be around Picc Line.  Left: No evidence of thrombosis in the subclavian.  *See table(s) above for measurements and observations.  Diagnosing physician: Sherald Hess MD Electronically signed by Sherald Hess MD on 09/26/2022 at 3:43:23 PM.    Final    [place] EKG SITE RITE  Result Date: 09/26/2022 If Site Rite image  not attached, placement could not be confirmed due to current cardiac rhythm.   Anti-infectives: Anti-infectives (From admission, onward)    Start     Dose/Rate Route Frequency Ordered Stop   09/24/22 0800  ceFAZolin (ANCEF) IVPB 2g/100 mL premix  Status:  Discontinued        2 g 200 mL/hr over 30 Minutes Intravenous On call to O.R. 09/24/22 0712 09/24/22 1524       Assessment/Plan:  POD 4 s/p exploratory laparotomy with right hemicolectomy for Small bowel intussusception with associated mass, partial obstruction and GI bleed on 09/24/2022 by Dr. Fredricka Bonine    Final path shows a well-differentiated neuroendocrine tumor. CEA pending CT chest unremarkable except for the 2.8 cm right liver lobe lesion  Pt reports she is to have a biopsy today and was NPO at midnight.  Notes from yesterday from Dr. Fredricka Bonine said liver biopsy is on hold and oncology recommends a outpt MRI of the liver first.  From a surgical standpoint, if no procedures are planned today, she may be put back on clear liquids  Hgb 8.5 this morning. Will follow Hold anticoag meds Continue TNA  Abigail Miyamoto MD 09/28/2022

## 2022-09-28 NOTE — Progress Notes (Signed)
PROGRESS NOTE  Meghan Holland  DOB: [birth year]  PCP: Patient, No Pcp Per [id number]  DOA: 09/22/2022  LOS: 5 days  Hospital Day: 7  Brief narrative: Meghan Holland is a 37 y.o. female with PMH significant for iron deficiency anemia 7/7, patient presented to the ED with complaint of abdominal cramping pain radiating to back, black stool, loss of appetite, 40 pounds weight loss in a year. In the ED, she had a low hemoglobin of 5.3, FOBT positive CTA abdomen/pelvis showed small bowel intussusception and hyperenhancing mass lesion in the central abdomen measuring 4 x 3.7 cm.   Multiple air-fluid bubbles were noted in small bowel, SBO.   She was also found to have hypoenhancing lesion in the right lobe of liver, suspicious for metastasis. General surgery was consulted. 7/9, patient underwent ex lap with right hemicolectomy   Subjective Patient was seen and examined this morning.   Lying on bed.   Not in distress CT abdomen yesterday showed SBO.  Patient had bowel movement last night.  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 Fredricka Bonine Preliminary biopsy report showed neuroendocrine tumor with nodal involvement.  Oncology consult appreciated.  Outpatient care planned.  Postop ileus Currently in postop ileus.  Receiving TPN through PICC line in the right CT abdomen 7/14 suggested possible bowel obstruction.  Patient had bowel present.   Started on clear liquid diet.  Severe protein calorie malnutrition Secondary to suspected malignancy.   Currently on TPN.  Severe anemia Chronic iron deficiency anemia Likely from chronic GI blood loss due to intra-abdominal mass Hemoglobin low at 5.3.  Ferritin level was severely low at 2. 3 units PRBC transfused so far. Hemoglobin at 8.5 today.   Recent Labs    09/22/22 1546 09/22/22 2100 09/23/22 0020 09/24/22 1048 09/25/22 0528  09/26/22 0522 09/27/22 0508 09/28/22 0351  HGB 5.3*  --    < > 9.5* 7.4* 6.4* 9.2* 8.5*  MCV 67.4*  --    < > 76.8* 76.1* 77.7* 80.2 81.2  VITAMINB12  --  250  --   --   --   --   --   --   FOLATE  --  7.9  --   --   --   --   --   --   FERRITIN  --  2*  --   --   --   --   --   --   TIBC  --  508*  --   --   --   --   --   --   IRON  --  5*  --   --   --   --   --   --   RETICCTPCT 2.3  --   --   --   --   --   --   --    < > = values in this interval not displayed.   Hypokalemia/hypomagnesemia Potassium and magnesium level improved with replacement. Recent Labs  Lab 09/23/22 0020 09/24/22 0528 09/24/22 1045 09/25/22 0528 09/26/22 0522 09/27/22 0508 09/28/22 0351  K 3.6   < > 3.8 3.5 3.1* 3.8 3.8  MG 1.8  --   --   --  1.5* 2.0 2.0  PHOS 3.5  --   --   --  4.2 3.9 4.7*   < > = values in this interval not displayed.   Superficial venous thrombosis  PICC line placed on right arm on 7/10.  Patient started noticing swelling on the right arm since last night.  Ultrasound duplex were obtained this morning which showed superficial venous thrombosis on cephalic vein distally can and also around the PICC line.   Findings were discussed with general surgery team as well.  Patient is at high risk of clotting at any IV access site because of underlying malignancy..  At this time plan is to continue the existing PICC line and monitor. Not a candidate for anticoagulation given severe anemia.  Mobility: Encourage ambulation  Goals of care   Code Status: Full Code     DVT prophylaxis:  SCDs Start: 09/22/22 2307   Antimicrobials: Perioperative antibiotics Fluid: TPN Consultants: General surgery Family Communication: family member at bedside  Status: Inpatient Level of care:  Progressive   Patient from: Home Anticipated d/c to: Pending clinical course Needs to continue in-hospital care:  postop ileus   Diet:  Diet Order             Diet NPO time specified Except for: Sips  with Meds  Diet effective midnight                   Scheduled Meds:  Chlorhexidine Gluconate Cloth  6 each Topical Daily   insulin aspart  0-9 Units Subcutaneous Q6H   metoCLOPramide (REGLAN) injection  10 mg Intravenous Q6H   mupirocin ointment  1 Application Nasal BID   sodium chloride flush  10-40 mL Intracatheter Q12H   thiamine (VITAMIN B1) injection  100 mg Intravenous Daily    PRN meds: bisacodyl, HYDROmorphone (DILAUDID) injection, melatonin, ondansetron (ZOFRAN) IV, mouth rinse, oxyCODONE, polyethylene glycol, prochlorperazine, sodium chloride flush   Infusions:   dextrose 5% lactated ringers 45 mL/hr at 09/27/22 1728   dextrose 5% lactated ringers     methocarbamol (ROBAXIN) IV 500 mg (09/28/22 0958)   TPN ADULT (ION) 55 mL/hr at 09/27/22 1843   TPN ADULT (ION)      Antimicrobials: Anti-infectives (From admission, onward)    Start     Dose/Rate Route Frequency Ordered Stop   09/24/22 0800  ceFAZolin (ANCEF) IVPB 2g/100 mL premix  Status:  Discontinued        2 g 200 mL/hr over 30 Minutes Intravenous On call to O.R. 09/24/22 0712 09/24/22 1524       Nutritional status:  Body mass index is 17.46 kg/m.  Nutrition Problem: Severe Malnutrition Etiology: chronic illness Signs/Symptoms: moderate fat depletion, moderate muscle depletion, energy intake < or equal to 75% for > or equal to 1 month, percent weight loss     Objective: Vitals:   09/28/22 0642 09/28/22 1233  BP: 99/72 113/73  Pulse: 74 76  Resp:  16  Temp: 98.2 F (36.8 C) 98.4 F (36.9 C)  SpO2: (!) 89% 100%    Intake/Output Summary (Last 24 hours) at 09/28/2022 1503 Last data filed at 09/28/2022 0600 Gross per 24 hour  Intake 1651.01 ml  Output --  Net 1651.01 ml   Filed Weights   09/26/22 0559 09/27/22 0500 09/28/22 0500  Weight: 46.2 kg 52.3 kg 52.1 kg   Weight change: -0.2 kg Body mass index is 17.46 kg/m.   Physical Exam: General exam: Pleasant young African-American  female.  Thin built. Skin: No rashes, lesions or ulcers. HEENT: Atraumatic, normocephalic, no obvious bleeding Lungs: Clear to auscultation bilaterally CVS: Regular rate and rhythm, no murmur GI/Abd soft, appropriate postop tenderness, bowel sounds sluggish CNS: Alert, awake, oriented  x 3 Psychiatry: Sad affect Extremities: No pedal edema, no calf tenderness.  Right arm with PICC line and swelling.  Data Review: I have personally reviewed the laboratory data and studies available.  F/u labs ordered Unresulted Labs (From admission, onward)     Start     Ordered   09/29/22 0500  Basic metabolic panel  Tomorrow morning,   R       Question:  Specimen collection method  Answer:  IV Team=IV Team collect   09/28/22 1415   09/29/22 0500  Phosphorus  Tomorrow morning,   R       Question:  Specimen collection method  Answer:  IV Team=IV Team collect   09/28/22 1415   09/27/22 0500  CEA  Tomorrow morning,   R       Question:  Specimen collection method  Answer:  IV Team=IV Team collect   09/26/22 1522   09/26/22 0500  Comprehensive metabolic panel  (TPN Lab Panel)  Every Mon,Thu (0500),   R     Question:  Specimen collection method  Answer:  Unit=Unit collect   09/25/22 0852   09/26/22 0500  Magnesium  (TPN Lab Panel)  Every Mon,Thu (0500),   R     Question:  Specimen collection method  Answer:  Unit=Unit collect   09/25/22 0852   09/26/22 0500  Phosphorus  (TPN Lab Panel)  Every Mon,Thu (0500),   R     Question:  Specimen collection method  Answer:  Unit=Unit collect   09/25/22 0852   09/26/22 0500  Triglycerides  (TPN Lab Panel)  Every Monday (0500),   R     Question:  Specimen collection method  Answer:  Unit=Unit collect   09/25/22 0852   Pending  Hemoglobin and hematocrit, blood  Once,   R       Question:  Specimen collection method  Answer:  IV Team=IV Team collect   Pending            Total time spent in review of labs and imaging, patient evaluation, formulation of plan,  documentation and communication with family: 45 minutes  Signed, Meghan Glass, MD [facility] 09/28/2022

## 2022-09-29 DIAGNOSIS — K561 Intussusception: Secondary | ICD-10-CM | POA: Diagnosis not present

## 2022-09-29 LAB — GLUCOSE, CAPILLARY: Glucose-Capillary: 102 mg/dL — ABNORMAL HIGH (ref 70–99)

## 2022-09-29 LAB — BASIC METABOLIC PANEL
Anion gap: 6 (ref 5–15)
BUN: 9 mg/dL (ref 6–20)
CO2: 24 mmol/L (ref 22–32)
Calcium: 8.6 mg/dL — ABNORMAL LOW (ref 8.9–10.3)
Chloride: 104 mmol/L (ref 98–111)
Creatinine, Ser: 0.45 mg/dL (ref 0.44–1.00)
GFR, Estimated: >60 mL/min (ref >60)
Glucose, Bld: 86 mg/dL (ref 70–99)
Potassium: 4.2 mmol/L (ref 3.5–5.1)
Sodium: 134 mmol/L — ABNORMAL LOW (ref 135–145)

## 2022-09-29 LAB — PHOSPHORUS: Phosphorus: 4.5 mg/dL (ref 2.5–4.6)

## 2022-09-29 LAB — CEA: CEA: 1.1 ng/mL (ref 0.0–4.7)

## 2022-09-29 MED ORDER — ACETAMINOPHEN 325 MG PO TABS
650.0000 mg | ORAL_TABLET | Freq: Four times a day (QID) | ORAL | Status: DC | PRN
  Administered 2022-09-29: 650 mg via ORAL
  Filled 2022-09-29: qty 2

## 2022-09-29 MED ORDER — TRAVASOL 10 % IV SOLN
INTRAVENOUS | Status: AC
  Filled 2022-09-29: qty 873.6

## 2022-09-29 NOTE — Progress Notes (Signed)
PROGRESS NOTE  Meghan Holland  DOB: Jul 26, 1985  PCP: Patient, No Pcp Per [id number]  DOA: 09/22/2022  LOS: 6 days  Hospital Day: 8  Brief narrative: Meghan Holland is a 37 y.o. female with PMH significant for iron deficiency anemia 7/7, patient presented to the ED with complaint of abdominal cramping pain radiating to back, black stool, loss of appetite, 40 pounds weight loss in a year. In the ED, she had a low hemoglobin of 5.3, FOBT positive CTA abdomen/pelvis showed small bowel intussusception and hyperenhancing mass lesion in the central abdomen measuring 4 x 3.7 cm.   Multiple air-fluid bubbles were noted in small bowel, SBO.   She was also found to have hypoenhancing lesion in the right lobe of liver, suspicious for metastasis. General surgery was consulted. 7/9, patient underwent ex lap with right hemicolectomy   Subjective Patient was seen and examined this morning.   Lying on bed.  Not in distress. No bowel movement this morning.  Able to tolerate clear liquid diet.  Surgery advance to full liquid diet today. Pain controlled.  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  Oncology consult appreciated.  Outpatient care planned.  Postop ileus Currently in postop ileus.  Receiving TPN through PICC line in the right CT abdomen 7/14 suggested possible bowel obstruction.  Patient had bowel present.   Tolerated clear liquid diet.  Advanced to full liquid diet today.  Severe protein calorie malnutrition Secondary to suspected malignancy.   Currently on TPN.  Severe anemia Chronic iron deficiency anemia Likely from chronic GI blood loss due to intra-abdominal mass Hemoglobin low at 5.3.  Ferritin level was severely low at 2. 3 units PRBC transfused so  far. Hemoglobin at 8.5 on last check yesterday. Recent Labs    09/22/22 1546 09/22/22 2100 09/23/22 0020 09/24/22 1048 09/25/22 0528 09/26/22 0522 09/27/22 0508 09/28/22 0351  HGB 5.3*  --    < > 9.5* 7.4* 6.4* 9.2* 8.5*  MCV 67.4*  --    < > 76.8* 76.1* 77.7* 80.2 81.2  VITAMINB12  --  250  --   --   --   --   --   --   FOLATE  --  7.9  --   --   --   --   --   --   FERRITIN  --  2*  --   --   --   --   --   --   TIBC  --  508*  --   --   --   --   --   --   IRON  --  5*  --   --   --   --   --   --   RETICCTPCT 2.3  --   --   --   --   --   --   --    < > = values in this interval not displayed.   Hypokalemia/hypomagnesemia Potassium and magnesium level improved with replacement. Recent Labs  Lab 09/23/22 0020 09/24/22 0528 09/25/22 0528 09/26/22 0522 09/27/22 0508 09/28/22 0351 09/29/22 0242  K 3.6   < > 3.5 3.1* 3.8 3.8 4.2  MG 1.8  --   --  1.5* 2.0 2.0  --   PHOS 3.5  --   --  4.2 3.9 4.7* 4.5   < > =  values in this interval not displayed.   Superficial venous thrombosis PICC line placed on right arm on 7/10.  Patient started noticing swelling on the right arm since last night.  Ultrasound duplex were obtained this morning which showed superficial venous thrombosis on cephalic vein distally can and also around the PICC line.   Findings were discussed with general surgery team as well.  Patient is at high risk of clotting at any IV access site because of underlying malignancy..  At this time plan is to continue the existing PICC line and monitor. Not a candidate for anticoagulation given severe anemia.  Mobility: Encourage ambulation  Goals of care   Code Status: Full Code     DVT prophylaxis:  SCDs Start: 09/22/22 2307   Antimicrobials: Perioperative antibiotics Fluid: TPN Consultants: General surgery Family Communication: family member at bedside  Status: Inpatient Level of care:  Progressive   Patient from: Home Anticipated d/c to: Pending  clinical course Needs to continue in-hospital care:  postop ileus on TPN   Diet:  Diet Order             Diet full liquid Fluid consistency: Thin  Diet effective now                   Scheduled Meds:  Chlorhexidine Gluconate Cloth  6 each Topical Daily   metoCLOPramide (REGLAN) injection  10 mg Intravenous Q6H   sodium chloride flush  10-40 mL Intracatheter Q12H   thiamine (VITAMIN B1) injection  100 mg Intravenous Daily    PRN meds: bisacodyl, HYDROmorphone (DILAUDID) injection, melatonin, ondansetron (ZOFRAN) IV, mouth rinse, oxyCODONE, polyethylene glycol, prochlorperazine, sodium chloride flush   Infusions:   dextrose 5% lactated ringers Stopped (09/28/22 2229)   methocarbamol (ROBAXIN) IV 500 mg (09/29/22 0909)   TPN ADULT (ION) 70 mL/hr at 09/29/22 0600   TPN ADULT (ION)      Antimicrobials: Anti-infectives (From admission, onward)    Start     Dose/Rate Route Frequency Ordered Stop   09/24/22 0800  ceFAZolin (ANCEF) IVPB 2g/100 mL premix  Status:  Discontinued        2 g 200 mL/hr over 30 Minutes Intravenous On call to O.R. 09/24/22 1324 09/24/22 1524       Nutritional status:  Body mass index is 17.46 kg/m.  Nutrition Problem: Severe Malnutrition Etiology: chronic illness Signs/Symptoms: moderate fat depletion, moderate muscle depletion, energy intake < or equal to 75% for > or equal to 1 month, percent weight loss     Objective: Vitals:   09/29/22 1247 09/29/22 1248  BP: 96/68 99/70  Pulse: 80 82  Resp: 17   Temp: 99 F (37.2 C)   SpO2: 100% 100%    Intake/Output Summary (Last 24 hours) at 09/29/2022 1442 Last data filed at 09/29/2022 1251 Gross per 24 hour  Intake 3785.3 ml  Output --  Net 3785.3 ml   Filed Weights   09/26/22 0559 09/27/22 0500 09/28/22 0500  Weight: 46.2 kg 52.3 kg 52.1 kg   Weight change:  Body mass index is 17.46 kg/m.   Physical Exam: General exam: Pleasant young African-American female.  Thin  built. Skin: No rashes, lesions or ulcers. HEENT: Atraumatic, normocephalic, no obvious bleeding Lungs: Clear to auscultation bilaterally CVS: Regular rate and rhythm, no murmur GI/Abd soft, appropriate postop tenderness, bowel sound present CNS: Alert, awake, oriented x 3 Psychiatry: Sad affect Extremities: No pedal edema, no calf tenderness.  Right arm with PICC line and swelling.  Data Review:  I have personally reviewed the laboratory data and studies available.  F/u labs ordered Unresulted Labs (From admission, onward)     Start     Ordered   09/26/22 0500  Comprehensive metabolic panel  (TPN Lab Panel)  Every Mon,Thu (0500),   R     Question:  Specimen collection method  Answer:  Unit=Unit collect   09/25/22 0852   09/26/22 0500  Magnesium  (TPN Lab Panel)  Every Mon,Thu (0500),   R     Question:  Specimen collection method  Answer:  Unit=Unit collect   09/25/22 0852   09/26/22 0500  Phosphorus  (TPN Lab Panel)  Every Mon,Thu (0500),   R     Question:  Specimen collection method  Answer:  Unit=Unit collect   09/25/22 0852   09/26/22 0500  Triglycerides  (TPN Lab Panel)  Every Monday (0500),   R     Question:  Specimen collection method  Answer:  Unit=Unit collect   09/25/22 0852            Total time spent in review of labs and imaging, patient evaluation, formulation of plan, documentation and communication with family: 45 minutes  Signed, Lorin Glass, MD [facility] 09/29/2022

## 2022-09-29 NOTE — Plan of Care (Signed)
  Problem: Education: Goal: Knowledge of General Education information will improve Description: Including pain rating scale, medication(s)/side effects and non-pharmacologic comfort measures Outcome: Progressing   Problem: Clinical Measurements: Goal: Ability to maintain clinical measurements within normal limits will improve Outcome: Progressing Goal: Will remain free from infection Outcome: Progressing Goal: Diagnostic test results will improve Outcome: Progressing   Problem: Coping: Goal: Ability to adjust to condition or change in health will improve Outcome: Progressing

## 2022-09-29 NOTE — Progress Notes (Signed)
5 Days Post-Op   Subjective/Chief Complaint: Denies nausea Tolerated clears Had another BM Pain well controlled   Objective: Vital signs in last 24 hours: Temp:  [98 F (36.7 C)-98.4 F (36.9 C)] 98 F (36.7 C) (07/14 0645) Pulse Rate:  [76-81] 76 (07/14 0645) Resp:  [16] 16 (07/13 1233) BP: (99-113)/(64-73) 99/64 (07/14 0645) SpO2:  [100 %] 100 % (07/14 0645) Last BM Date : 09/28/22  Intake/Output from previous day: 07/13 0701 - 07/14 0700 In: 3305.3 [P.O.:480; I.V.:2660.3; IV Piggyback:165] Out: -  Intake/Output this shift: No intake/output data recorded.  Exam: Awake and alert Comfortable Abdomen soft, non-distended, incision clean  Lab Results:  Recent Labs    09/27/22 0508 09/28/22 0351  WBC 9.4 7.6  HGB 9.2* 8.5*  HCT 29.9* 27.6*  PLT 298 283   BMET Recent Labs    09/28/22 0351 09/29/22 0242  NA 134* 134*  K 3.8 4.2  CL 104 104  CO2 25 24  GLUCOSE 112* 86  BUN 8 9  CREATININE 0.49 0.45  CALCIUM 8.0* 8.6*   PT/INR Recent Labs    09/27/22 0508  LABPROT 14.5  INR 1.1   ABG No results for input(s): "PHART", "HCO3" in the last 72 hours.  Invalid input(s): "PCO2", "PO2"  Studies/Results: CT CHEST W CONTRAST  Result Date: 09/27/2022 CLINICAL DATA:  Small bowel neoplasm, evaluate for metastatic disease EXAM: CT CHEST WITH CONTRAST TECHNIQUE: Multidetector CT imaging of the chest was performed during intravenous contrast administration. RADIATION DOSE REDUCTION: This exam was performed according to the departmental dose-optimization program which includes automated exposure control, adjustment of the mA and/or kV according to patient size and/or use of iterative reconstruction technique. CONTRAST:  75mL OMNIPAQUE IOHEXOL 300 MG/ML  SOLN COMPARISON:  CT done on 09/22/2022 FINDINGS: Cardiovascular: No acute findings are seen. Mediastinum/Nodes: No significant lymphadenopathy is seen. Lungs/Pleura: There is no focal pulmonary consolidation. No  discrete lung nodules are seen. Small bilateral pleural effusions are seen. There is no pneumothorax. Upper Abdomen: Pneumoperitoneum is seen in upper abdomen. This may be related to recent surgery and abdomen. Another possibility would be bowel perforation. There is 2.8 cm low-density lesion in the right lobe of liver. Small to moderate ascites is seen. There is dilation of small-bowel loops. Musculoskeletal: No acute findings are seen. IMPRESSION: There are no nodules or infiltrates in the lung fields. No significant lymphadenopathy is seen. Small bilateral pleural effusions. There is 2.8 cm low-density in the right lobe of liver suggesting possible metastatic disease. The dilation of small-bowel loops suggesting small bowel obstruction. Ascites. Pneumoperitoneum in the upper abdomen may be related to recent abdominal surgery. Another possibility would be bowel perforation. These results will be called to the ordering clinician or representative by the Radiologist Assistant, and communication documented in the PACS or [employer]. Electronically Signed   By: Ernie Avena M.D.   On: 09/27/2022 13:11    Anti-infectives: Anti-infectives (From admission, onward)    Start     Dose/Rate Route Frequency Ordered Stop   09/24/22 0800  ceFAZolin (ANCEF) IVPB 2g/100 mL premix  Status:  Discontinued        2 g 200 mL/hr over 30 Minutes Intravenous On call to O.R. 09/24/22 0712 09/24/22 1524       Assessment/Plan: POD 5 s/p exploratory laparotomy with right hemicolectomy for Small bowel intussusception with associated mass, partial obstruction and GI bleed on 09/24/2022 by Dr. Fredricka Bonine      Final path shows a well-differentiated neuroendocrine tumor. CEA pending  CT chest unremarkable except for the 2.8 cm right liver lobe lesion  Advance diet Ambulate Hopefully can start weaning TNA soon  Abigail Miyamoto MD 09/29/2022

## 2022-09-29 NOTE — Progress Notes (Signed)
PHARMACY - TOTAL PARENTERAL NUTRITION CONSULT NOTE   Indication:  severe malnutrition, ileus anticipated s/p surgery  Patient Measurements: Height: 5\' 8"  (172.7 cm) Weight: 52.1 kg (114 lb 13.8 oz) IBW/kg (Calculated) : 63.9 TPN AdjBW (KG): 46 Body mass index is 17.46 kg/m. Usual Weight:   Assessment: - Small bowel mass with ileocolonic intussusception, mass, partial obstruction, and GIB-status post exploratory laparotomy with right hemicolectomy 09/24/2022. 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 - PMH: underweight (lost 40lbs since 8/23)  Glucose / Insulin:  - No h/o DM. A1C 4.7 - CBG 98-109 ( Goal <150), No SSI given. Will discontinue Electrolytes:  - K+ 4.2. Mg 2 - CorrCa 9.2 (albumin 2.5)  - Phos 4.7>4.5 (removed from TPN 7/13) - Magnesium up to 2 Renal:  - Scr <1 Hepatic:  - LFT's WNL Intake / Output; MIVF:  - MIVF: D5LR at 39ml/hr - PO:  - UOP: unmeasured - LBM 7/13  GI Imaging: 7/7: CT angio: Small bowel mass with ileocolonic intussusception, liver lesion suspicious for mets.  GI Surgeries / Procedures:  7/9: exploratory laparotomy with right hemicolectomy  Central access: Place PICC 7/10--superficial thrombus surrounding her PICC line.   TPN start date: 09/25/22  Nutritional Goals: Goal 43ml/hr (less fluid) to provide 1827 kcal and 87g protein  RD Assessment: Estimated Nutritional Needs:  ( 7/10)  Kcal:  1700-1900  Protein:  85-95g  Fluid:  1.9L/day   Current Nutrition:  NPO  Plan:  TPN at goal 70 ml/hr and con't to monitor for refeeding. (1680 ml fluid) Electrolytes in TPN:  Inc Na 79mEq/L  Inc K 63mEq/L  Inc Ca 39mEq/L,  Inc Mg 48mEq/L Phos 7mmol/L. Cl:Ac 1:1 Add standard MVI and trace elements to TPN D/c CBG checks and SSI D5LR at kvo Monitor TPN labs on Mon/Thurs,  AND prn Advance diet and hopefully wean TPN tomorrow.   Meghan Holland, PharmD,  BCPS Clinical Staff Pharmacist [email or web address] 09/29/2022 9:02 AM

## 2022-09-30 DIAGNOSIS — K561 Intussusception: Secondary | ICD-10-CM | POA: Diagnosis not present

## 2022-09-30 LAB — COMPREHENSIVE METABOLIC PANEL
ALT: 16 U/L (ref 0–44)
AST: 19 U/L (ref 15–41)
Albumin: 3.3 g/dL — ABNORMAL LOW (ref 3.5–5.0)
Alkaline Phosphatase: 52 U/L (ref 38–126)
Anion gap: 6 (ref 5–15)
BUN: 11 mg/dL (ref 6–20)
CO2: 24 mmol/L (ref 22–32)
Calcium: 8.9 mg/dL (ref 8.9–10.3)
Chloride: 103 mmol/L (ref 98–111)
Creatinine, Ser: 0.49 mg/dL (ref 0.44–1.00)
GFR, Estimated: >60 mL/min (ref >60)
Glucose, Bld: 98 mg/dL (ref 70–99)
Potassium: 4.3 mmol/L (ref 3.5–5.1)
Sodium: 133 mmol/L — ABNORMAL LOW (ref 135–145)
Total Bilirubin: 0.6 mg/dL (ref 0.3–1.2)
Total Protein: 7.4 g/dL (ref 6.5–8.1)

## 2022-09-30 LAB — PHOSPHORUS: Phosphorus: 3.8 mg/dL (ref 2.5–4.6)

## 2022-09-30 LAB — MAGNESIUM: Magnesium: 2 mg/dL (ref 1.7–2.4)

## 2022-09-30 LAB — TRIGLYCERIDES: Triglycerides: 74 mg/dL (ref <150)

## 2022-09-30 MED ORDER — ENOXAPARIN SODIUM 30 MG/0.3ML IJ SOSY
30.0000 mg | PREFILLED_SYRINGE | INTRAMUSCULAR | Status: DC
  Administered 2022-09-30: 30 mg via SUBCUTANEOUS
  Filled 2022-09-30: qty 0.3

## 2022-09-30 MED ORDER — ENSURE ENLIVE PO LIQD
237.0000 mL | Freq: Three times a day (TID) | ORAL | Status: DC
  Administered 2022-09-30 – 2022-10-01 (×3): 237 mL via ORAL

## 2022-09-30 MED ORDER — TRAVASOL 10 % IV SOLN
INTRAVENOUS | Status: DC
  Filled 2022-09-30: qty 374.4

## 2022-09-30 NOTE — Discharge Instructions (Signed)
[facility]      [facility] °[phone] ° °OPEN ABDOMINAL SURGERY: POST OP INSTRUCTIONS ° °Always review your discharge instruction sheet given to you by the facility where your surgery was performed. ° °IF YOU HAVE DISABILITY OR FAMILY LEAVE FORMS, YOU MUST BRING THEM TO THE OFFICE FOR PROCESSING.  PLEASE DO NOT GIVE THEM TO YOUR DOCTOR. ° °A prescription for pain medication may be given to you upon discharge.  Take your pain medication as prescribed, if needed.  If narcotic pain medicine is not needed, then you may take acetaminophen (Tylenol) or ibuprofen (Advil) as needed. °Take your usually prescribed medications unless otherwise directed. °If you need a refill on your pain medication, please contact your pharmacy. They will contact our office to request authorization.  Prescriptions will not be filled after 5pm or on week-ends. °You should follow a light diet the first few days after arrival home, such as soup and crackers, pudding, etc.unless your doctor has advised otherwise. A high-fiber, low fat diet can be resumed as tolerated.   Be sure to include lots of fluids daily. Most patients will experience some swelling and bruising on the chest and neck area.  Ice packs will help.  Swelling and bruising can take several days to resolve °Most patients will experience some swelling and bruising in the area of the incision. Ice pack will help. Swelling and bruising can take several days to resolve..  °It is common to experience some constipation if taking pain medication after surgery.  Increasing fluid intake and taking a stool softener will usually help or prevent this problem from occurring.  A mild laxative (Milk of Magnesia or Miralax) should be taken according to package directions if there are no bowel movements after 48 hours. ° You may have steri-strips (small skin tapes) in place directly over the incision.  These strips should be left on the skin for 7-10 days.  If your surgeon used skin  glue on the incision, you may shower in 24 hours.  The glue will flake off over the next 2-3 weeks.  Any sutures or staples will be removed at the office during your follow-up visit. You may find that a light gauze bandage over your incision may keep your staples from being rubbed or pulled. You may shower and replace the bandage daily. °ACTIVITIES:  You may resume regular (light) daily activities beginning the next day--such as daily self-care, walking, climbing stairs--gradually increasing activities as tolerated.  You may have sexual intercourse when it is comfortable.  Refrain from any heavy lifting or straining until approved by your doctor. °You may drive when you no longer are taking prescription pain medication, you can comfortably wear a seatbelt, and you can safely maneuver your car and apply brakes °You should see your doctor in the office for a follow-up appointment approximately two weeks after your surgery.  Make sure that you call for this appointment within a day or two after you arrive home to insure a convenient appointment time. °OTHER INSTRUCTIONS:  °_____________________________________________________________ °_____________________________________________________________ ° °WHEN TO CALL YOUR DOCTOR: °Fever over 101.0 °Inability to urinate °Nausea and/or vomiting °Extreme swelling or bruising °Continued bleeding from incision. °Increased pain, redness, or drainage from the incision. °Difficulty swallowing or breathing °Muscle cramping or spasms. °Numbness or tingling in hands or feet or around lips. ° °The clinic staff is available to answer your questions during regular business hours.  Please don’t hesitate to call and ask to speak to one of the nurses if you   have concerns. ° °For further questions, please visit [email or web address] ° ° ° °

## 2022-09-30 NOTE — Progress Notes (Signed)
PHARMACY - TOTAL PARENTERAL NUTRITION CONSULT NOTE   Indication:  severe malnutrition, ileus anticipated s/p surgery  Patient Measurements: Height: 5\' 8"  (172.7 cm) Weight: 47.6 kg (104 lb 15 oz) IBW/kg (Calculated) : 63.9 TPN AdjBW (KG): 46 Body mass index is 15.96 kg/m. Usual Weight:   Assessment: Small bowel mass with ileocolonic intussusception, mass, partial obstruction, and GIB-status post exploratory laparotomy with right hemicolectomy 09/24/2022. 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 - PMH: underweight (lost 40lbs since 8/23)  Glucose / Insulin: No h/o DM. A1C 4.7 - SSI insulin d/ced on 7/14 - CBGs goal (<150): 98 from bmet Electrolytes: Na low 133; other lytes wnl including CorrCa Renal:  scr <1, BUN wml Hepatic:  - LFT's WNL - Albumin: 3.3 - TG 74 (7/15) Intake / Output; MIVF:  - MIVF: D5LR at 53ml/hr - I/O: +1951 mL - making urine - LBM 7/14  GI Imaging: - 7/7: CT angio: Small bowel mass with ileocolonic intussusception, liver lesion suspicious for mets. - 7/12 chest CT: Small bilateral pleural effusions. Low-density in the right lobe of liver suggesting possible metastatic disease  GI Surgeries / Procedures:  7/9: exploratory laparotomy with right hemicolectomy  Central access: Place PICC 7/10--superficial thrombus surrounding her PICC line.   TPN start date: 09/25/22  Nutritional Goals: Goal 46ml/hr (less fluid) to provide 1827 kcal and 87g protein  RD Assessment: Estimated Nutritional Needs:  ( 7/10)  Kcal:  1700-1900  Protein:  85-95g  Fluid:  1.9L/day   Current Nutrition:  NPO  Plan:   - Per [facility], "start to wean TPN 7/15 - ok to wean off to stop 7/16"  At 1800:  - Reduce TPN rate by half to 30 ml/hr - Electrolytes in TPN:  Inc Na to 100 mEq/L  K 65 mEq/L  Ca 53mEq/L Mg 87mEq/L Add Phos 5 mmol/L Cl:Ac 1:1 - Add standard MVI and trace elements to TPN - D5LR  at kvo - Monitor TPN labs on Mon/Thurs,  AND prn  Dorna Leitz, PharmD, BCPS 09/30/2022 8:08 AM

## 2022-09-30 NOTE — Progress Notes (Signed)
Nutrition Follow-up  DOCUMENTATION CODES:   Severe malnutrition in context of chronic illness  INTERVENTION:  Handouts from AND: -underweight MNT -high calorie, high protein MNT -low FODMAP   Ensure Plus High Protein po TID, each supplement provides 350 kcal and 20 grams of protein.  Begin to wean TPN   Encourage po intake    NUTRITION DIAGNOSIS:   Severe Malnutrition related to chronic illness as evidenced by moderate fat depletion, moderate muscle depletion, energy intake < or equal to 75% for > or equal to 1 month, percent weight loss.  -ongoing   GOAL:   Patient will meet greater than or equal to 90% of their needs  -progressing as TPN is weaned, and po intake increases   MONITOR:   Labs, Weight trends, I & O's (TPN)  REASON FOR ASSESSMENT:   Consult, Malnutrition Screening Tool New TPN/TNA  ASSESSMENT:   37 year old female with medical history of iron deficiency anemia presented to [facility] ED with complaints of worsening abdominal cramping with radiation to her back.  Patient states that she had no appetite and lost about 40 pounds since August 2023.  Also had 3 episodes of bloody stools. CTA abdomen/pelvis showed small bowel intussusception and hyperenhancing mass lesion in the central abdomen measuring 4 x 3.7 cm.  Multiple air-fluid bubbles noted in small bowel, SBO.  Also found to have hypoenhancing lesion in the right lobe of liver, suspicious for metastasis.  Patient's diet upgraded from FLD to regular Will start to wean TPN   POD 6  s/p exploratory laparotomy with right hemicolectomy for Small bowel intussusception with associated mass, partial obstruction and GI bleed on 09/24/2022  -pathology indicated neuroendocrine tumor   Visited patient at bedside who had  tolerated breakfast. She denies N/V/D/C at this time. Patient is eager to go home tomorrow and is ready for TPN to be dc'd. RD informed patient that we will dc TPN if she continues to tolerate solid  food.   Patient is requesting diet edu on what she should eat at home. She reports she loves food. RD encouraged her to prioritize protein intake. Patient reports she plans on continuing low FODMAP diet and has so far identified dairy as a trigger food.   Labs:  Na 133 Meds: reglan, NS, thiamine, robaxin, D5 @20 , TPN @70  Wt: admit- 98#, current- 104.9#  PO: 0% I/O's:  +16.5 L since admission    Diet Order:   Diet Order             Diet regular Room service appropriate? Yes; Fluid consistency: Thin  Diet effective now                   EDUCATION NEEDS:   Education needs have been addressed  Skin:  Skin Assessment: Skin Integrity Issues: Skin Integrity Issues:: Incisions Incisions: 7/9 abdomen  Last BM:  7/15 type 6  Height:   Ht Readings from Last 1 Encounters:  09/22/22 5\' 8"  (1.727 m)    Weight:   Wt Readings from Last 1 Encounters:  09/30/22 47.6 kg    Ideal Body Weight:     BMI:  Body mass index is 15.96 kg/m.  Estimated Nutritional Needs:   Kcal:  1700-1900  Protein:  85-95g  Fluid:  1.9L/day    Leodis Rains, RDN, LDN  Clinical Nutrition

## 2022-09-30 NOTE — Progress Notes (Signed)
PROGRESS NOTE  Meghan Holland  DOB: Apr 18, 1985  PCP: Patient, No Pcp Per GNF:[id number]  DOA: 09/22/2022  LOS: 7 days  Hospital Day: 9  Brief narrative: Meghan Holland is a 37 y.o. female with PMH significant for iron deficiency anemia 7/7, patient presented to the ED with complaint of abdominal cramping pain radiating to back, black stool, loss of appetite, 40 pounds weight loss in a year. In the ED, she had a low hemoglobin of 5.3, FOBT positive CTA abdomen/pelvis showed small bowel intussusception and hyperenhancing mass lesion in the central abdomen measuring 4 x 3.7 cm.   Multiple air-fluid bubbles were noted in small bowel, SBO.   She was also found to have hypoenhancing lesion in the right lobe of liver, suspicious for metastasis. General surgery was consulted. 7/9, patient underwent ex lap with right hemicolectomy   Subjective Patient was seen and examined this morning.   Lying down in bed.  Not in distress.  General surgery upgraded to regular diet today.  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  Oncology consult appreciated.  Outpatient care planned.  Postop ileus Currently in postop ileus.  Receiving TPN through PICC line in the right CT abdomen 7/14 suggested possible bowel obstruction.  Improved within the following 24 hours. Regular diet ordered this morning by general surgery.  Severe protein calorie malnutrition Secondary to suspected malignancy.   Currently on TPN.  Being weaned down  Severe anemia Chronic iron deficiency anemia Likely from chronic GI blood loss due to intra-abdominal mass Hemoglobin low at 5.3.  Ferritin level was severely low at 2. 3 units PRBC transfused so far. Hemoglobin at 8.5 on last  check on 7/13.  Repeat labs tomorrow. Recent Labs    09/22/22 1546 09/22/22 2100 09/23/22 0020 09/24/22 1048 09/25/22 0528 09/26/22 0522 09/27/22 0508 09/28/22 0351  HGB 5.3*  --    < > 9.5* 7.4* 6.4* 9.2* 8.5*  MCV 67.4*  --    < > 76.8* 76.1* 77.7* 80.2 81.2  VITAMINB12  --  250  --   --   --   --   --   --   FOLATE  --  7.9  --   --   --   --   --   --   FERRITIN  --  2*  --   --   --   --   --   --   TIBC  --  508*  --   --   --   --   --   --   IRON  --  5*  --   --   --   --   --   --   RETICCTPCT 2.3  --   --   --   --   --   --   --    < > = values in this interval not displayed.   Hypokalemia/hypomagnesemia Potassium and magnesium level improved with replacement. Recent Labs  Lab 09/26/22 0522 09/27/22 0508 09/28/22 0351 09/29/22 0242 09/30/22 0300  K 3.1* 3.8 3.8 4.2 4.3  MG 1.5* 2.0 2.0  --  2.0  PHOS 4.2 3.9 4.7* 4.5 3.8   Superficial venous thrombosis PICC line placed on right arm on 7/10.  Patient started noticing swelling on the right arm  since last night.  Ultrasound duplex were obtained this morning which showed superficial venous thrombosis on cephalic vein distally can and also around the PICC line.   Findings were discussed with general surgery team as well.  Patient is at high risk of clotting at any IV access site because of underlying malignancy..  At this time plan is to continue the existing PICC line and monitor. Not a candidate for anticoagulation given severe anemia.  Mobility: Encourage ambulation  Goals of care   Code Status: Full Code     DVT prophylaxis:  enoxaparin (LOVENOX) injection 30 mg Start: 09/30/22 1200 SCDs Start: 09/22/22 2307   Antimicrobials: Perioperative antibiotics Fluid: TPN Consultants: General surgery Family Communication: Sister at bedside  Status: Inpatient Level of care:  Progressive   Patient from: Home Anticipated d/c to: Pending clinical course Needs to continue in-hospital care:  postop ileus  improved.  TPN being weaned down. Hopefully home in 1 to 2 days   Diet:  Diet Order             Diet regular Room service appropriate? Yes; Fluid consistency: Thin  Diet effective now                   Scheduled Meds:  Chlorhexidine Gluconate Cloth  6 each Topical Daily   enoxaparin (LOVENOX) injection  30 mg Subcutaneous Q24H   metoCLOPramide (REGLAN) injection  10 mg Intravenous Q6H   sodium chloride flush  10-40 mL Intracatheter Q12H   thiamine (VITAMIN B1) injection  100 mg Intravenous Daily    PRN meds: acetaminophen, bisacodyl, HYDROmorphone (DILAUDID) injection, melatonin, ondansetron (ZOFRAN) IV, mouth rinse, oxyCODONE, polyethylene glycol, prochlorperazine, sodium chloride flush   Infusions:   dextrose 5% lactated ringers Stopped (09/28/22 2229)   methocarbamol (ROBAXIN) IV 500 mg (09/30/22 0957)   TPN ADULT (ION) 70 mL/hr at 09/29/22 1813   TPN ADULT (ION)      Antimicrobials: Anti-infectives (From admission, onward)    Start     Dose/Rate Route Frequency Ordered Stop   09/24/22 0800  ceFAZolin (ANCEF) IVPB 2g/100 mL premix  Status:  Discontinued        2 g 200 mL/hr over 30 Minutes Intravenous On call to O.R. 09/24/22 0712 09/24/22 1524       Nutritional status:  Body mass index is 15.96 kg/m.  Nutrition Problem: Severe Malnutrition Etiology: chronic illness Signs/Symptoms: moderate fat depletion, moderate muscle depletion, energy intake < or equal to 75% for > or equal to 1 month, percent weight loss     Objective: Vitals:   09/29/22 2115 09/30/22 0511  BP: 102/74 98/66  Pulse: 84 85  Resp: (!) 22 16  Temp: 98.1 F (36.7 C) 98 F (36.7 C)  SpO2: 100% 100%    Intake/Output Summary (Last 24 hours) at 09/30/2022 1313 Last data filed at 09/29/2022 2300 Gross per 24 hour  Intake 1471.4 ml  Output --  Net 1471.4 ml   Filed Weights   09/27/22 0500 09/28/22 0500 09/30/22 0511  Weight: 52.3 kg 52.1 kg 47.6 kg   Weight change:  Body mass  index is 15.96 kg/m.   Physical Exam: General exam: Pleasant young African-American female.  Thin built. Skin: No rashes, lesions or ulcers. HEENT: Atraumatic, normocephalic, no obvious bleeding Lungs: Clear to auscultation bilaterally CVS: Regular rate and rhythm, no murmur GI/Abd soft, appropriate postop tenderness, bowel sound present CNS: Alert, awake, oriented x 3 Psychiatry: Sad affect Extremities: No pedal edema, no calf tenderness.  Improving swelling  right arm  Data Review: I have personally reviewed the laboratory data and studies available.  F/u labs ordered Unresulted Labs (From admission, onward)     Start     Ordered   10/01/22 0500  CBC with Differential/Platelet  Tomorrow morning,   R       Question:  Specimen collection method  Answer:  IV Team=IV Team collect   09/30/22 1010   09/26/22 0500  Comprehensive metabolic panel  (TPN Lab Panel)  Every Mon,Thu (0500),   R     Question:  Specimen collection method  Answer:  Unit=Unit collect   09/25/22 0852   09/26/22 0500  Magnesium  (TPN Lab Panel)  Every Mon,Thu (0500),   R     Question:  Specimen collection method  Answer:  Unit=Unit collect   09/25/22 0852   09/26/22 0500  Phosphorus  (TPN Lab Panel)  Every Mon,Thu (0500),   R     Question:  Specimen collection method  Answer:  Unit=Unit collect   09/25/22 0852   09/26/22 0500  Triglycerides  (TPN Lab Panel)  Every Monday (0500),   R     Question:  Specimen collection method  Answer:  Unit=Unit collect   09/25/22 0852            Total time spent in review of labs and imaging, patient evaluation, formulation of plan, documentation and communication with family: 45 minutes  Signed, Lorin Glass, MD [facility] 09/30/2022

## 2022-09-30 NOTE — TOC Progression Note (Signed)
Transition of Care [facility]) - Progression Note    Patient Details  Name: Meghan Holland MRN: [id number] Date of Birth: 10-Aug-1985  Transition of [facility]) CM/SW Contact  Starnisha Batrez, Olegario Messier, RN Phone Number: 09/30/2022, 2:50 PM  Clinical Narrative: Patients d/c plan home.TPN weaning.      Expected Discharge Plan: Home/Self Care Barriers to Discharge: Continued Medical Work up  Expected Discharge Plan and Services                                               Social Determinants of Health (SDOH) Interventions SDOH Screenings   Food Insecurity: No Food Insecurity (09/22/2022)  Housing: Low Risk  (09/22/2022)  Transportation Needs: No Transportation Needs (09/22/2022)  Utilities: Not At Risk (09/22/2022)  Tobacco Use: Low Risk  (09/22/2022)    Readmission Risk Interventions     No data to display

## 2022-09-30 NOTE — Plan of Care (Signed)
  Problem: Clinical Measurements: Goal: Ability to maintain clinical measurements within normal limits will improve Outcome: Progressing Goal: Will remain free from infection Outcome: Progressing Goal: Diagnostic test results will improve Outcome: Progressing Goal: Respiratory complications will improve Outcome: Progressing Goal: Cardiovascular complication will be avoided Outcome: Progressing   Problem: Pain Managment: Goal: General experience of comfort will improve Outcome: Progressing   Problem: Nutritional: Goal: Maintenance of adequate nutrition will improve Outcome: Progressing Goal: Progress toward achieving an optimal weight will improve Outcome: Progressing

## 2022-09-30 NOTE — Progress Notes (Signed)
   Progress Note  6 Days Post-Op  Subjective: Pt tolerating FLD and having bowel function. Pain well controlled. Family at bedside. Saw Dr. Truett Perna this AM.   Objective: Vital signs in last 24 hours: Temp:  [98 F (36.7 C)-99 F (37.2 C)] 98 F (36.7 C) (07/15 0511) Pulse Rate:  [80-85] 85 (07/15 0511) Resp:  [16-22] 16 (07/15 0511) BP: (96-102)/(66-74) 98/66 (07/15 0511) SpO2:  [100 %] 100 % (07/15 0511) Weight:  [47.6 kg] 47.6 kg (07/15 0511) Last BM Date : 09/29/22  Intake/Output from previous day: 07/14 0701 - 07/15 0700 In: 1951.4 [P.O.:720; I.V.:1176.4; IV Piggyback:55] Out: -  Intake/Output this shift: No intake/output data recorded.  PE: General: pleasant, WD, cachectic female who is laying in bed in NAD Lungs:  Respiratory effort nonlabored Abd: soft, appropriately ttp, ND, incision C/D/I Psych: A&Ox3 with an appropriate affect.    Lab Results:  Recent Labs    09/28/22 0351  WBC 7.6  HGB 8.5*  HCT 27.6*  PLT 283   BMET Recent Labs    09/29/22 0242 09/30/22 0300  NA 134* 133*  K 4.2 4.3  CL 104 103  CO2 24 24  GLUCOSE 86 98  BUN 9 11  CREATININE 0.45 0.49  CALCIUM 8.6* 8.9   PT/INR No results for input(s): "LABPROT", "INR" in the last 72 hours. CMP     Component Value Date/Time   NA 133 (L) 09/30/2022 0300   K 4.3 09/30/2022 0300   CL 103 09/30/2022 0300   CO2 24 09/30/2022 0300   GLUCOSE 98 09/30/2022 0300   BUN 11 09/30/2022 0300   CREATININE 0.49 09/30/2022 0300   CALCIUM 8.9 09/30/2022 0300   PROT 7.4 09/30/2022 0300   ALBUMIN 3.3 (L) 09/30/2022 0300   AST 19 09/30/2022 0300   ALT 16 09/30/2022 0300   ALKPHOS 52 09/30/2022 0300   BILITOT 0.6 09/30/2022 0300   GFRNONAA >60 09/30/2022 0300   GFRAA >60 08/02/2014 0337   Lipase     Component Value Date/Time   LIPASE 23 09/22/2022 1546       Studies/Results: No results found.  Anti-infectives: Anti-infectives (From admission, onward)    Start     Dose/Rate Route  Frequency Ordered Stop   09/24/22 0800  ceFAZolin (ANCEF) IVPB 2g/100 mL premix  Status:  Discontinued        2 g 200 mL/hr over 30 Minutes Intravenous On call to O.R. 09/24/22 0712 09/24/22 1524        Assessment/Plan  POD6 s/p exploratory laparotomy with right hemicolectomy for Small bowel intussusception with associated mass, partial obstruction and GI bleed on 09/24/2022 by Dr. Fredricka Bonine  - final path with well-differentiated neuroendocrine tumor, CEA not elevated, 5/28 nodes positive - ONC following, appreciate input - tolerating FLD - advance to regular diet and start to wean TPN today - continue to mobilize - pt may be ready for DC from surgery standpoint in the next 24 hrs if tolerating diet advancement   FEN: reg diet, wean TPN VTE: ok to have LMWH or SQH from surgery standpoint ID: [id number] 7/9  LOS: 7 days     Juliet Rude, [facility] 09/30/2022, 9:52 AM Please see Amion for pager number during day hours 7:00am-4:30pm

## 2022-09-30 NOTE — Consult Note (Signed)
New Hematology/Oncology Consult   Requesting MD: Lorin Glass        Reason for Consult: Small bowel carcinoid tumor  HPI: Meghan Holland presented to the emergency room on 09/22/2022 with cramping abdominal pain.  She reports the pain began a few months ago.  The pain is worse after eating.  She reports losing approximately 40 pounds over the past year.  She had blood in the stool on 09/22/2022.  A CBC on 09/22/2022 found the hemogram 5.3, MCV 67.4, platelets 458,000, and WBCs 7.4.  Ferritin returned at 2.  A CT angiogram revealed a long segment of small bowel intussusception with a heterogenously hyperenhancing mass at the intussuscepted lead segment measuring 4 x 3.7 cm.  A hypoenhancing lesion was noted in the peripheral right liver segment 7/8 measured 3 x 2.5 cm.  Dr. Fredricka Bonine was consulted.  She was transfused 3 units of packed red blood cells and then taken to the operating room on 09/24/2022 for an exploratory laparotomy and right colectomy.  An ileocolic intussusception was found with an associated mass.  A 2 cm mass was noted at the ileocolic pedicle.  The liver lesion on CT was palpated at the right lateral liver and could not be visualized for a biopsy.  No other peritoneal or omental lesions were noted.  Anastomosis was created between the transverse colon and ileum.  She reports feeling better following surgery.  She is having bowel movements and tolerating a diet.   Past Medical History:  Diagnosis Date   Medical history non-contributory    Palpitations    negative echo   G1, P1   Past Surgical History:  Procedure Laterality Date   LAPAROTOMY N/A 09/24/2022   Procedure: EXPLORATORY LAPAROTOMY WITH RIGHT HEMICOLECTOMY;  Surgeon: Berna Bue, MD;  Location: [facility];  Service: General;  Laterality: N/A;   NO PAST SURGERIES    :   Current Facility-Administered Medications:    acetaminophen (TYLENOL) tablet 650 mg, 650 mg, Oral, Q6H PRN, Luiz Iron, NP, 650 mg at 09/29/22  2250   bisacodyl (DULCOLAX) suppository 10 mg, 10 mg, Rectal, Daily PRN, Berna Bue, MD   Chlorhexidine Gluconate Cloth 2 % PADS 6 each, 6 each, Topical, Daily, Dahal, Binaya, MD, 6 each at 09/30/22 0958   dextrose 5 % in lactated ringers infusion, , Intravenous, Continuous, Norva Pavlov, RPH, Stopped at 09/28/22 2229   enoxaparin (LOVENOX) injection 30 mg, 30 mg, Subcutaneous, Q24H, Dahal, Binaya, MD, 30 mg at 09/30/22 1257   feeding supplement (ENSURE ENLIVE / ENSURE PLUS) liquid 237 mL, 237 mL, Oral, TID BM, Dahal, Binaya, MD, 237 mL at 09/30/22 1502   HYDROmorphone (DILAUDID) injection 0.5-1 mg, 0.5-1 mg, Intravenous, Q2H PRN, Barnetta Chapel, PA-C, 1 mg at 09/27/22 1040   melatonin tablet 5 mg, 5 mg, Oral, QHS PRN, Barnetta Chapel, PA-C, 5 mg at 09/29/22 2216   methocarbamol (ROBAXIN) 500 mg in dextrose 5 % 50 mL IVPB, 500 mg, Intravenous, Q8H, Barnetta Chapel, PA-C, Last Rate: 110 mL/hr at 09/30/22 0957, 500 mg at 09/30/22 0957   metoCLOPramide (REGLAN) injection 10 mg, 10 mg, Intravenous, Q6H, Connor, Chelsea A, MD, 10 mg at 09/30/22 1258   ondansetron (ZOFRAN) injection 4 mg, 4 mg, Intravenous, Q6H PRN, Phylliss Blakes A, MD, 4 mg at 09/27/22 6295   Oral care mouth rinse, 15 mL, Mouth Rinse, PRN, Barnetta Chapel, PA-C   oxyCODONE (Oxy IR/ROXICODONE) immediate release tablet 5-10 mg, 5-10 mg, Oral, Q6H PRN, Barnetta Chapel, PA-C, 5 mg  at 09/29/22 0016   polyethylene glycol (MIRALAX / GLYCOLAX) packet 17 g, 17 g, Oral, Daily PRN, Barnetta Chapel, PA-C   prochlorperazine (COMPAZINE) injection 5 mg, 5 mg, Intravenous, Q6H PRN, Barnetta Chapel, PA-C, 5 mg at 09/27/22 1041   sodium chloride flush (NS) 0.9 % injection 10-40 mL, 10-40 mL, Intracatheter, Q12H, Dahal, Binaya, MD   sodium chloride flush (NS) 0.9 % injection 10-40 mL, 10-40 mL, Intracatheter, PRN, Dahal, Binaya, MD   thiamine (VITAMIN B1) injection 100 mg, 100 mg, Intravenous, Daily, Fredricka Bonine, Chelsea A, MD, 100 mg at 09/30/22  0940   TPN ADULT (ION), , Intravenous, Continuous TPN, Norva Pavlov, RPH, Stopped at 09/30/22 1751   TPN ADULT (ION), , Intravenous, Continuous TPN, Lucia Gaskins, RPH, Last Rate: 30 mL/hr at 09/30/22 1749, New Bag at 09/30/22 1749:   Chlorhexidine Gluconate Cloth  6 each Topical Daily   enoxaparin (LOVENOX) injection  30 mg Subcutaneous Q24H   feeding supplement  237 mL Oral TID BM   metoCLOPramide (REGLAN) injection  10 mg Intravenous Q6H   sodium chloride flush  10-40 mL Intracatheter Q12H   thiamine (VITAMIN B1) injection  100 mg Intravenous Daily  :  No Known Allergies:  FH: Mother had lung cancer, maternal grandmother had breast cancer, maternal aunt with pancreas cancer  SOCIAL HISTORY: She lives with her husband and child in [place].  She does not use cigarettes.  Social alcohol use.  She works from home.  No transfusion history.  No risk factor for HIV or hepatitis.  Review of Systems:  Positives include: Abdominal pain, weight loss, blood in the stool  A complete ROS was otherwise negative.   Physical Exam:  Blood pressure 102/73, pulse 96, temperature 98.8 F (37.1 C), temperature source Oral, resp. rate (!) 22, height 5\' 8"  (1.727 m), weight 104 lb 15 oz (47.6 kg), last menstrual period 08/25/2022, SpO2 100%, currently breastfeeding.  HEENT: Oral cavity without visible mass, neck without mass Lungs: Clear bilaterally Cardiac: Regular rate and rhythm Abdomen: No hepatosplenomegaly, healed midline incision, soft  Vascular: No leg edema Lymph nodes: No cervical, supraclavicular, axillary, or inguinal nodes Neurologic: Alert and oriented Musculoskeletal: No spine tenderness  LABS:   Recent Labs    09/28/22 0351  WBC 7.6  HGB 8.5*  HCT 27.6*  PLT 283     Recent Labs    09/29/22 0242 09/30/22 0300  NA 134* 133*  K 4.2 4.3  CL 104 103  CO2 24 24  GLUCOSE 86 98  BUN 9 11  CREATININE 0.45 0.49  CALCIUM 8.6* 8.9      RADIOLOGY:  CT  CHEST W CONTRAST  Result Date: 09/27/2022 CLINICAL DATA:  Small bowel neoplasm, evaluate for metastatic disease EXAM: CT CHEST WITH CONTRAST TECHNIQUE: Multidetector CT imaging of the chest was performed during intravenous contrast administration. RADIATION DOSE REDUCTION: This exam was performed according to the departmental dose-optimization program which includes automated exposure control, adjustment of the mA and/or kV according to patient size and/or use of iterative reconstruction technique. CONTRAST:  75mL OMNIPAQUE IOHEXOL 300 MG/ML  SOLN COMPARISON:  CT done on 09/22/2022 FINDINGS: Cardiovascular: No acute findings are seen. Mediastinum/Nodes: No significant lymphadenopathy is seen. Lungs/Pleura: There is no focal pulmonary consolidation. No discrete lung nodules are seen. Small bilateral pleural effusions are seen. There is no pneumothorax. Upper Abdomen: Pneumoperitoneum is seen in upper abdomen. This may be related to recent surgery and abdomen. Another possibility would be bowel perforation. There is 2.8 cm low-density lesion  in the right lobe of liver. Small to moderate ascites is seen. There is dilation of small-bowel loops. Musculoskeletal: No acute findings are seen. IMPRESSION: There are no nodules or infiltrates in the lung fields. No significant lymphadenopathy is seen. Small bilateral pleural effusions. There is 2.8 cm low-density in the right lobe of liver suggesting possible metastatic disease. The dilation of small-bowel loops suggesting small bowel obstruction. Ascites. Pneumoperitoneum in the upper abdomen may be related to recent abdominal surgery. Another possibility would be bowel perforation. These results will be called to the ordering clinician or representative by the Radiologist Assistant, and communication documented in the PACS or [employer]. Electronically Signed   By: Ernie Avena M.D.   On: 09/27/2022 13:11   VAS [place] UPPER EXTREMITY VENOUS DUPLEX  Result  Date: 09/26/2022 UPPER VENOUS STUDY  Patient Name:  Meghan Holland  Date of Exam:   09/26/2022 Medical Rec #: [id number]        Accession #:    [id number] Date of Birth: [birth year]         Patient Gender: F Patient Age:   15 years Exam Location:  [facility] Procedure:      VAS [place] UPPER EXTREMITY VENOUS DUPLEX Referring Phys: ABIGAIL CHAVEZ --------------------------------------------------------------------------------  Indications: recent iv and picc line placement, pain, swelling Comparison Study: No previous study. Performing Technologist: McKayla Maag RVT, [place]  Examination Guidelines: A complete evaluation includes B-mode imaging, spectral Doppler, color Doppler, and power Doppler as needed of all accessible portions of each vessel. Bilateral testing is considered an integral part of a complete examination. Limited examinations for reoccurring indications may be performed as noted.  Right Findings: +----------+------------+---------+-----------+----------+-------+ RIGHT     CompressiblePhasicitySpontaneousPropertiesSummary +----------+------------+---------+-----------+----------+-------+ IJV           Full       Yes       Yes                      +----------+------------+---------+-----------+----------+-------+ Subclavian    Full       Yes       Yes                      +----------+------------+---------+-----------+----------+-------+ Axillary      Full       Yes       Yes                      +----------+------------+---------+-----------+----------+-------+ Brachial      Full       Yes       Yes                      +----------+------------+---------+-----------+----------+-------+ Radial        Full                                          +----------+------------+---------+-----------+----------+-------+ Ulnar         Full                                          +----------+------------+---------+-----------+----------+-------+ Cephalic      None        No        No  Acute  +----------+------------+---------+-----------+----------+-------+ Basilic       None       No        No                Acute  +----------+------------+---------+-----------+----------+-------+  Left Findings: +----------+------------+---------+-----------+----------+-------+ LEFT      CompressiblePhasicitySpontaneousPropertiesSummary +----------+------------+---------+-----------+----------+-------+ Subclavian    Full       Yes       Yes                      +----------+------------+---------+-----------+----------+-------+  Summary:  Right: No evidence of deep vein thrombosis in the upper extremity. Findings consistent with acute superficial vein thrombosis involving the right basilic vein, mid upper arm to [facility] fossa, and right cephalic vein, proximal upper arm to mid forearm. The superficial thrombus in the basilic vein is noted to be around Picc Line.  Left: No evidence of thrombosis in the subclavian.  *See table(s) above for measurements and observations.  Diagnosing physician: Sherald Hess MD Electronically signed by Sherald Hess MD on 09/26/2022 at 3:43:23 PM.    Final    [place] EKG SITE RITE  Result Date: 09/26/2022 If Site Rite image not attached, placement could not be confirmed due to current cardiac rhythm.  [place] EKG SITE RITE  Result Date: 09/25/2022 If Site Rite image not attached, placement could not be confirmed due to current cardiac rhythm.  CT ANGIO GI BLEED  Result Date: 09/22/2022 CLINICAL DATA:  Mesenteric ischemia, abdominal pain radiating to back, nausea, vomiting, diarrhea * Tracking Code: BO * EXAM: CTA ABDOMEN AND PELVIS WITHOUT AND WITH CONTRAST TECHNIQUE: Multidetector CT imaging of the abdomen and pelvis was performed using the standard protocol during bolus administration of intravenous contrast. Multiplanar reconstructed images and MIPs were obtained and reviewed to evaluate the vascular anatomy. RADIATION  DOSE REDUCTION: This exam was performed according to the departmental dose-optimization program which includes automated exposure control, adjustment of the mA and/or kV according to patient size and/or use of iterative reconstruction technique. CONTRAST:  OMNIPAQUE IOHEXOL 350 MG/ML SOLN COMPARISON:  None Available. FINDINGS: VASCULAR Normal contour and caliber of the abdominal aorta. No evidence of aneurysm, dissection, or other acute aortic pathology. Standard branching pattern of the abdominal aorta, with solitary bilateral renal arteries. The branch vessel origins are patent, with specific attention to the superior mesenteric artery, which is widely patent through its origin and proximal order branch vessels. Review of the MIP images confirms the above findings. NON-VASCULAR Lower chest: No acute abnormality. Hepatobiliary: Hypoenhancing subcapsular lesion of the peripheral right lobe of the liver, hepatic segment VII/VIII, measuring 3.0 x 2.5 cm (series 11, image 17). No gallstones, gallbladder wall thickening, or biliary dilatation. Pancreas: Unremarkable. No pancreatic ductal dilatation or surrounding inflammatory changes. Spleen: Normal in size without significant abnormality. Adrenals/Urinary Tract: Adrenal glands are unremarkable. Kidneys are normal, without renal calculi, solid lesion, or hydronephrosis. Bladder is unremarkable. Stomach/Bowel: Stomach is within normal limits. Very abnormal appearance of the mid small bowel, with a long segment small bowel small bowel intussusception, and a heterogeneously hyperenhancing mass lesion about the intussuscepted lead segment in the central abdomen measuring 4.0 x 3.7 cm (series 14, image 29). Multiple air and fluid-filled, although not overtly distended loops of small bowel in proximity to this segment, however without overt bowel obstruction. Lymphatic: No significant vascular findings are present. No enlarged abdominal or pelvic lymph nodes.  Reproductive: No mass or other significant abnormality. Other: No abdominal wall hernia or abnormality.  Small volume free fluid throughout the abdomen. Musculoskeletal: No acute or significant osseous findings. IMPRESSION: 1. Very abnormal appearance of the mid small bowel, with a long segment small bowel intussusception, and a heterogeneously hyperenhancing mass lesion about the intussuscepted lead segment in the central abdomen measuring 4.0 x 3.7 cm. Multiple air and fluid-filled, although not overtly distended loops of small bowel in proximity to this segment, however without overt bowel obstruction. 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 2. Hypoenhancing lesion of the peripheral right lobe of the liver, incompletely characterized but suspicious for a metastasis. Recommend multiphasic contrast enhanced MR to better characterize. 3. Small volume free fluid throughout the abdomen. 4. No evidence of aortic aneurysm, dissection, or other acute aortic pathology. The branch vessel origins are patent, with specific attention to the superior mesenteric artery, which is widely patent through its origin and proximal order branch vessels. No significant atherosclerosis. Electronically Signed   By: Jearld Lesch M.D.   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,pT2pN2 4.3 cm, tumor extends into muscularis propria, positive lymphovascular invasion, 5/28 lymph nodes positive, negative resection margins, mitotic rate less than 2/10 high-powered fields, Ki-67 less than 1%, largest metastatic mesenteric lymph node 2.8 cm at the mesenteric margin CT angiogram GI bleed 09/22/2022-long segment small bowel intussusception with a hyperenhancing mass at the intussuscepted lead segment, 4 x 3.7 cm, hypoenhancing segment 7/eight 3 x 2.5 cm lesion CT chest 09/27/2022-no lung  nodules, no lymphadenopathy, small bilateral pleural effusions 2.8 cm low-density right liver lesion 2.  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  Iron deficiency anemia 4.  Weight loss secondary to #1  Ms. Bastien presented with an ileocolonic intussusception.  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  We reviewed details of the surgical pathology report, the prognosis, and treatment options.  There are multiple positive mesenteric lymph nodes and a suspicious right liver lesion.  She appears to be recovering from surgery and will be discharged to home soon.  I recommend an outpatient dotatate PET to be followed by an appointment at the cancer center.  We will consider observation, somatostatin analog therapy, and hepatic directed therapy depending on the dotatate PET result.  We will check a chromogranin A level.  Recommendations: Postoperative care per the surgical medical services Chromogranin A Outpatient dotatate PET Outpatient follow-up at the Cancer center will be scheduled for within the next few weeks  Thornton Papas, MD 09/30/2022, 5:53 PM

## 2022-09-30 NOTE — Plan of Care (Signed)
  Problem: Health Behavior/Discharge Planning: Goal: Ability to manage health-related needs will improve Outcome: Progressing   

## 2022-10-01 ENCOUNTER — Other Ambulatory Visit: Payer: Self-pay | Admitting: *Deleted

## 2022-10-01 ENCOUNTER — Other Ambulatory Visit: Payer: Self-pay | Admitting: Oncology

## 2022-10-01 ENCOUNTER — Other Ambulatory Visit (HOSPITAL_COMMUNITY): Payer: Self-pay

## 2022-10-01 DIAGNOSIS — K561 Intussusception: Secondary | ICD-10-CM | POA: Diagnosis not present

## 2022-10-01 DIAGNOSIS — C7A012 Malignant carcinoid tumor of the ileum: Secondary | ICD-10-CM

## 2022-10-01 DIAGNOSIS — D3A012 Benign carcinoid tumor of the ileum: Secondary | ICD-10-CM | POA: Insufficient documentation

## 2022-10-01 DIAGNOSIS — D3A8 Other benign neuroendocrine tumors: Secondary | ICD-10-CM | POA: Insufficient documentation

## 2022-10-01 LAB — CBC WITH DIFFERENTIAL/PLATELET
Abs Immature Granulocytes: 0.05 10*3/uL (ref 0.00–0.07)
Basophils Absolute: 0.1 10*3/uL (ref 0.0–0.1)
Basophils Relative: 1 %
Eosinophils Absolute: 0.3 10*3/uL (ref 0.0–0.5)
Eosinophils Relative: 5 %
HCT: 32.8 % — ABNORMAL LOW (ref 36.0–46.0)
Hemoglobin: 10 g/dL — ABNORMAL LOW (ref 12.0–15.0)
Immature Granulocytes: 1 %
Lymphocytes Relative: 20 %
Lymphs Abs: 1.4 10*3/uL (ref 0.7–4.0)
MCH: 25.4 pg — ABNORMAL LOW (ref 26.0–34.0)
MCHC: 30.5 g/dL (ref 30.0–36.0)
MCV: 83.2 fL (ref 80.0–100.0)
Monocytes Absolute: 0.5 10*3/uL (ref 0.1–1.0)
Monocytes Relative: 7 %
Neutro Abs: 4.7 10*3/uL (ref 1.7–7.7)
Neutrophils Relative %: 66 %
Platelets: 413 10*3/uL — ABNORMAL HIGH (ref 150–400)
RBC: 3.94 MIL/uL (ref 3.87–5.11)
RDW: 23.8 % — ABNORMAL HIGH (ref 11.5–15.5)
WBC: 7.1 10*3/uL (ref 4.0–10.5)
nRBC: 0 % (ref 0.0–0.2)

## 2022-10-01 MED ORDER — CALCIUM POLYCARBOPHIL 625 MG PO TABS
625.0000 mg | ORAL_TABLET | Freq: Two times a day (BID) | ORAL | Status: DC
  Administered 2022-10-01: 625 mg via ORAL
  Filled 2022-10-01: qty 1

## 2022-10-01 MED ORDER — OXYCODONE HCL 5 MG PO TABS
5.0000 mg | ORAL_TABLET | Freq: Four times a day (QID) | ORAL | 0 refills | Status: DC | PRN
Start: 2022-10-01 — End: 2023-02-11
  Filled 2022-10-01: qty 20, 3d supply, fill #0

## 2022-10-01 MED ORDER — ACETAMINOPHEN 325 MG PO TABS: 650.0000 mg | ORAL_TABLET | Freq: Four times a day (QID) | ORAL | Status: DC | PRN

## 2022-10-01 NOTE — Progress Notes (Signed)
10/01/2022  Meghan Holland [id number] 1986/03/11  CARE TEAM: PCP: Patient, No Pcp Per  Outpatient Care Team: Patient Care Team: Patient, No Pcp Per as PCP - General (General Practice) Artis Delay, MD as Consulting Physician (Hematology and Oncology)  Inpatient Treatment Team: Treatment Team:  Lorin Glass, MD Ccs, Md, MD Massie Kluver, MD Ladene Artist, MD Melene Muller, NT Hanley Hays, RN Lucia Gaskins, RPH Cashion, Cindie Crumbly, RN   Problem List:   Principal Problem:   Intussusception of small bowel [facility]) Active Problems:   Small bowel mass   Protein-calorie malnutrition, severe   09/24/2022  Surgeon: Phylliss Blakes MD FACS  Procedure performed: Exploratory laparotomy, right colectomy   Preop diagnosis: Small bowel intussusception with associated mass partial obstruction and GI bleed, liver mass  Post-op diagnosis/intraop findings: ileocolonic intussusception with mass; liver lesion ~segment 7  Assessment [facility] Stay = 8 days) 7 Days Post-Op    Postobstructive ileus resolving    Plan:  -Solid diet -Continue weaning off TPN -No need for telemetry from my standpoint. 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  Dr. Truett Perna aware and met the patient's morning. Okay to discharge from surgery standpoint.  Follow-up to see Dr. Doylene Canard 8/8 arranged -VTE prophylaxis- SCDs, etc -mobilize as tolerated to help recovery      I reviewed nursing notes, hospitalist notes, last 24 h vitals and pain scores, last 48 h intake and output, last 24 h labs and trends, and last 24 h imaging results.  I have reviewed this patient's available data, including medical history, events of note, test results, etc as part of my evaluation.   A significant portion of that time was spent in counseling. Care during the described time interval was provided by me.  This care required moderate level  of medical decision making.  10/01/2022    Subjective: (Chief complaint)  Spouse in room.  Patient tolerating solid diet.  No nausea or vomiting.  Pain controlled and not needing IV medications.  Really wants to go home.  Objective:  Vital signs:  Vitals:   09/30/22 0511 09/30/22 1422 09/30/22 2110 10/01/22 0607  BP: 98/66 102/73 101/69 105/72  Pulse: 85 96 95 91  Resp: 16 (!) 22 20 20   Temp: 98 F (36.7 C) 98.8 F (37.1 C) 98.2 F (36.8 C) 98 F (36.7 C)  TempSrc: Oral Oral Oral Oral  SpO2: 100% 100% 100% 100%  Weight: 47.6 kg   47.6 kg  Height:        Last BM Date : 09/30/22  Intake/Output   Yesterday:  07/15 0701 - 07/16 0700 In: 2589.5 [P.O.:720; I.V.:1649.5; IV Piggyback:220] Out: -  This shift:  No intake/output data recorded.  Bowel function:  Flatus: YES  BM:  YES  Drain: (No drain)   Physical Exam:  General: Pt awake/alert in no acute distress.  Rather thin and cachectic but alert.  Not withdrawn.  Not toxic. Eyes: PERRL, normal EOM.  Sclera clear.  No icterus Neuro: CN II-XII intact w/o focal sensory/motor deficits. Lymph: No head/neck/groin lymphadenopathy Psych:  No delerium/psychosis/paranoia.  Oriented x 4 HENT: Normocephalic, Mucus membranes moist.  No thrush Neck: Supple, No tracheal deviation.  No obvious thyromegaly Chest: No pain to chest wall compression.  Good respiratory excursion.  No audible wheezing CV:  Pulses intact.  Regular rhythm.  No major extremity edema MS: Normal AROM mjr joints.  No obvious deformity  Abdomen: Soft.  Nondistended.  Nontender.  Periumbilical midline incision with normal healing ridge.  No cellulitis or abscess.  No evidence of peritonitis.  No incarcerated hernias.  Ext:   No deformity.  No mjr edema.  No cyanosis Skin: No petechiae / purpurea.  No major sores.  Warm and dry    Results:    SURGICAL PATHOLOGY CASE: [id number] PATIENT: Meghan Holland Surgical Pathology  Report     Clinical History: Small bowel obstruction (crm)     FINAL MICROSCOPIC DIAGNOSIS:  A. 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 (5/28) Largest metastatic node at mesenteric margin is 2.8 cm (pN 2) Proximal and distal margins negative for tumor Benign appendix See oncology table  ONCOLOGY TABLE: JEJUNUM AND ILEUM NEUROENDOCRINE TUMOR: Resection Procedure: Resection of small bowel obstruction Tumor Site: Ileum Tumor Size: 4.3 x 3.7 x 2.3 cm Tumor Focality: Unifocal Histologic Type and Grade: Well-differentiated neuroendocrine tumor, G1      Mitotic Rate: Less than 2 per 10 high-power fields      Ki-67 Labeling Index: Less than 1% Tumor Extension: Into muscularis propria Lymphovascular Invasion: Present Large Mesenteric Masses (>2 cm): Present Margins: Proximal and distal margins negative for tumor.  Largest metastatic node located at mesenteric margin Regional Lymph Nodes:      Number of Lymph Nodes with Tumor: 5      Number of Lymph Nodes Examined: 28 Distant metastasis:      Distant Site(s) Involved: Not applicable Pathologic Stage Classification (pTNM, AJCC 8th Edition): pT2, pN2 Ancillary Studies: Immunohistochemistry, see comment Representative Tumor Block: A2-A6 Comment(s): The primary tumor in the ileum is 4.3 cm in greatest dimension and is a well-differentiated neuroendocrine tumor which extends into the muscularis propria.  There are five of twenty-eight lymph nodes with metastatic neuroendocrine tumor the largest of which is 2.8 cm and is situated at the mesenteric margin.  Immunohistochemistry is performed and the tumor is positive with CD56, synaptophysin, chromogranin and CDX2.  Ki-67 proliferation rate is less than 1%. Dr. Fredricka Bonine was notified via Epic  message service on 09/26/2022. (v1.1.0.0)   Alida Greiner DESCRIPTION: A. Received fresh and subsequently placed in formalin labeled with the patients name and MRN is a right hemicolectomy consisting of terminal ileum (25.9 cm long, 8.7 cm circumference), cecum (6.3 cm long, 12.6 cm circumference), appendix (9.5 cm long, 0.5 cm diameter), and portion of ascending colon (12.4 cm long, 12.2 cm circumference) with up to 4.5 cm of attached mesocolon. Ragged adhesions on the red-tan serosa bend the segment onto itself. At the mesocolonic margin is a 2.8 x 2.5 x 2.4 cm white-tan nodule. Opening reveals a 4.3 x 3.7 x 2.3 cm gray-than, fungating, indurated mass in the small bowel located 16.3 cm from the proximal resection margin (green), 5.5 cm from the ileocecal valve, and 5.4 cm from the f resection margin (orange). On cut surface, the mass remains superficial, invading <0.1 cm into the wall and remaining at least 0.6 cm form the serosa (black).  The remaining, uninvolved mucosa is tan with prominent folds. In the attached fibrofatty tissue are 28 tan, rubbery possible lymph nodes ranging from 0.2-1.5 cm in greatest dimension. Block Summary A1: resection margins, en face A2-A3: mass with serosa and adjacent normal A4-A6: additional representative mass A7-A8: nodule at mesocolonic margin A9: appendix A10: one sectioned node A11: one sectioned node A12: two bisected nodes (one inked black) A13:  six whole nodes A14: six whole nodes A15: six whole nodes A16: six whole nodes (LEF 09/25/2022)  Final Diagnosis performed by Jimmy Picket, MD.   Electronically signed 09/27/2022 Technical component performed at [facility], [address] [place], [place] [address].  Professional component performed at [employer]. [facility], [address], [place], [place] [address].  Immunohistochemistry Technical component (if applicable) was performed at [facility]. [address], [place], [place] [address].   IMMUNOHISTOCHEMISTRY DISCLAIMER (if applicable): Some of these immunohistochemical stains may have been developed and the performance characteristics determine by [facility]. Some may not have been cleared or approved by the U.S. Food and Drug Administration. The FDA has determined that such clearance or approval is not necessary. This test is used for clinical purposes. It should not be regarded as investigational or for research. This laboratory is certified under the Clinical Laboratory Improvement Amendments of 1988 (CLIA-88) as qualified to perform high complexity clinical laboratory testing.  The controls stained appropriately.   IHC stains are performed on formalin fixed, paraffin embedded tissue using a 3,3"diaminobenzidine (DAB) chromogen and Leica Bond Autostainer System. The staining intensity of the nucleus is score manually and is reported as the percentage of tumor cell nuclei demonstrating specific nuclear staining. The specimens are fixed in 10% Neutral Formalin for at least 6 hours and up to 72hrs. These tests are validated on decalcified tissue. Results should be interpreted with caution given the possibility of false negative results on decalcified specimens. Antibody Clones are as follows ER-clone 69F, PR-clone 16, Ki67- clone MM1. Some of these immunohistochemical stains may have been developed and the performance characteristics determined by [facility].    Cultures: Recent Results (from the past 720 hour(s))  Surgical PCR screen     Status: Abnormal   Collection Time: 09/24/22  8:39 AM   Specimen: Nasal Mucosa; Nasal Swab  Result Value Ref Range Status   MRSA, PCR NEGATIVE NEGATIVE Final   Staphylococcus aureus POSITIVE (A) NEGATIVE Final    Comment: (NOTE) The Xpert SA Assay (FDA approved for NASAL specimens in patients 27 years of age and older), is one component of a  comprehensive surveillance program. It is not intended to diagnose infection nor to guide or monitor treatment. Performed at [facility], [address], [place], [place] [address]     Labs: Results for orders placed or performed during the hospital encounter of 09/22/22 (from the past 48 hour(s))  Comprehensive metabolic panel     Status: Abnormal   Collection Time: 09/30/22  3:00 AM  Result Value Ref Range   Sodium 133 (L) 135 - 145 mmol/L   Potassium 4.3 3.5 - 5.1 mmol/L   Chloride 103 98 - 111 mmol/L   CO2 24 22 - 32 mmol/L   Glucose, Bld 98 70 - 99 mg/dL    Comment: Glucose reference range applies only to samples taken after fasting for at least 8 hours.   BUN 11 6 - 20 mg/dL   Creatinine, Ser 3.24 0.44 - 1.00 mg/dL   Calcium 8.9 8.9 - 40.1 mg/dL   Total Protein 7.4 6.5 - 8.1 g/dL   Albumin 3.3 (L) 3.5 - 5.0 g/dL   AST 19 15 - 41 U/L   ALT 16 0 - 44 U/L   Alkaline Phosphatase 52 38 - 126 U/L   Total Bilirubin 0.6 0.3 - 1.2 mg/dL   GFR, Estimated >02 >72 mL/min  Comment: (NOTE) Calculated using the CKD-EPI Creatinine Equation (2021)    Anion gap 6 5 - 15    Comment: Performed at [facility], [address] [place], [place] [address]  Magnesium     Status: None   Collection Time: 09/30/22  3:00 AM  Result Value Ref Range   Magnesium 2.0 1.7 - 2.4 mg/dL    Comment: Performed at [facility], [address], [place], [place] [address]  Phosphorus     Status: None   Collection Time: 09/30/22  3:00 AM  Result Value Ref Range   Phosphorus 3.8 2.5 - 4.6 mg/dL    Comment: Performed at [facility], [address], [place], [place] [address]  Triglycerides     Status: None   Collection Time: 09/30/22  3:00 AM  Result Value Ref Range   Triglycerides 74 <150 mg/dL    Comment: Performed at [facility], [address] [place], [place] [address]  CBC with Differential/Platelet      Status: Abnormal   Collection Time: 10/01/22  4:19 AM  Result Value Ref Range   WBC 7.1 4.0 - 10.5 K/uL   RBC 3.94 3.87 - 5.11 MIL/uL   Hemoglobin 10.0 (L) 12.0 - 15.0 g/dL   HCT 95.6 (L) 38.7 - 56.4 %   MCV 83.2 80.0 - 100.0 fL   MCH 25.4 (L) 26.0 - 34.0 pg   MCHC 30.5 30.0 - 36.0 g/dL   RDW 33.2 (H) 95.1 - 88.4 %   Platelets 413 (H) 150 - 400 K/uL   nRBC 0.0 0.0 - 0.2 %   Neutrophils Relative % 66 %   Neutro Abs 4.7 1.7 - 7.7 K/uL   Lymphocytes Relative 20 %   Lymphs Abs 1.4 0.7 - 4.0 K/uL   Monocytes Relative 7 %   Monocytes Absolute 0.5 0.1 - 1.0 K/uL   Eosinophils Relative 5 %   Eosinophils Absolute 0.3 0.0 - 0.5 K/uL   Basophils Relative 1 %   Basophils Absolute 0.1 0.0 - 0.1 K/uL   Immature Granulocytes 1 %   Abs Immature Granulocytes 0.05 0.00 - 0.07 K/uL   Dimorphism PRESENT     Comment: Performed at [facility], [address], [place], [place] [address]    Imaging / Studies: No results found.  Medications / Allergies: per chart  Antibiotics: Anti-infectives (From admission, onward)    Start     Dose/Rate Route Frequency Ordered Stop   09/24/22 0800  ceFAZolin (ANCEF) IVPB 2g/100 mL premix  Status:  Discontinued        2 g 200 mL/hr over 30 Minutes Intravenous On call to O.R. 09/24/22 3016 09/24/22 1524         Note: Portions of this report may have been transcribed using voice recognition software. Every effort was made to ensure accuracy; however, inadvertent computerized transcription errors may be present.   Any transcriptional errors that result from this process are unintentional.    Ardeth Sportsman, MD, FACS, MASCRS Esophageal, Gastrointestinal & Colorectal Surgery Robotic and Minimally Invasive Surgery  [facility] [address] [place], [place] [address] [phone] Fax [phone] Main  CONTACT INFORMATION:  Weekday (9AM-5PM): Call [facility] main  office at [phone]  Weeknight (5PM-9AM) or Weekend/Holiday: Check [email or web address] (password " [email or web address]") for General Surgery [facility] coverage  (Please, do not use SecureChat as it is not reliable communication to reach operating  surgeons for immediate patient care given surgeries/outpatient duties/clinic/cross-coverage/off post-call which would lead to a delay in care.  Epic staff messaging available for outptient concerns, but may not be answered for 48 hours or more).     10/01/2022  7:55 AM

## 2022-10-01 NOTE — Progress Notes (Signed)
Patient preferred to ambulate in the hall at 2030 on 7/15 opposed to midnight as scheduled per orders. Patient tolerated well.

## 2022-10-01 NOTE — TOC Transition Note (Signed)
Transition of Care [facility]) - CM/SW Discharge Note   Patient Details  Name: Meghan Holland MRN: [id number] Date of Birth: 1985/08/20  [facility]) CM/SW Contact:  Lanier Clam, RN Phone Number: 10/01/2022, 12:11 PM   Clinical Narrative: d/c home. No needs or orders.        Barriers to Discharge: No Barriers Identified   Patient Goals and CMS Choice      Discharge Placement                         Discharge Plan and Services Additional resources added to the After Visit Summary for                                       Social Determinants of Health (SDOH) Interventions SDOH Screenings   Food Insecurity: No Food Insecurity (09/22/2022)  Housing: Low Risk  (09/22/2022)  Transportation Needs: No Transportation Needs (09/22/2022)  Utilities: Not At Risk (09/22/2022)  Tobacco Use: Low Risk  (09/22/2022)     Readmission Risk Interventions     No data to display

## 2022-10-01 NOTE — Progress Notes (Signed)
PHARMACY - TOTAL PARENTERAL NUTRITION CONSULT NOTE   Indication:  severe malnutrition, ileus anticipated s/p surgery  Patient Measurements: Height: 5\' 8"  (172.7 cm) Weight: 47.6 kg (104 lb 15 oz) IBW/kg (Calculated) : 63.9 TPN AdjBW (KG): 46 Body mass index is 15.96 kg/m. Usual Weight:   Assessment: Small bowel mass with ileocolonic intussusception, mass, partial obstruction, and GIB-status post exploratory laparotomy with right hemicolectomy 09/24/2022. 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 - PMH: underweight (lost 40lbs since 8/23)  Glucose / Insulin: No h/o DM. A1C 4.7 - SSI insulin d/ced on 7/14 - CBGs goal (<150): 98 from bmet on 7/15 Electrolytes: labs on 7/15: Na low 133; other lytes wnl including CorrCa Renal:  scr <1, BUN wml Hepatic:  - LFT's WNL - Albumin: 3.3 - TG 74 (7/15) Intake / Output; MIVF:  - MIVF: D5LR at 6ml/hr - I/O: +2589 mL - making urine - LBM 7/14  GI Imaging: - 7/7: CT angio: Small bowel mass with ileocolonic intussusception, liver lesion suspicious for mets. - 7/12 chest CT: Small bilateral pleural effusions. Low-density in the right lobe of liver suggesting possible metastatic disease  GI Surgeries / Procedures:  7/9: exploratory laparotomy with right hemicolectomy  Central access: Place PICC 7/10--superficial thrombus surrounding her PICC line.   TPN start date: 09/25/22  Nutritional Goals: Goal 15ml/hr (less fluid) to provide 1827 kcal and 87g protein  RD Assessment: Estimated Nutritional Needs:  ( 7/10)  Kcal:  1700-1900  Protein:  85-95g  Fluid:  1.9L/day   Current Nutrition:  TPN  - 7/15: adv to regular diet   Plan:   - Per [facility], ok to wean patient off TPN today. - TPN is currently running at 30 ml/hr. Will d/c TPN at 6p when current bag runs out.   - If patient is getting discharged today before 6p, TPN can be stopped with no rate titration  needed. - Pharmacy will sign off. Re-consult [place] if further assistance is needed  Dorna Leitz, PharmD, BCPS 10/01/2022 7:22 AM

## 2022-10-01 NOTE — Plan of Care (Signed)
  Problem: Education: Goal: Knowledge of General Education information will improve Description: Including pain rating scale, medication(s)/side effects and non-pharmacologic comfort measures Outcome: Adequate for Discharge   Problem: Health Behavior/Discharge Planning: Goal: Ability to manage health-related needs will improve Outcome: Adequate for Discharge   Problem: Clinical Measurements: Goal: Ability to maintain clinical measurements within normal limits will improve Outcome: Adequate for Discharge Goal: Will remain free from infection Outcome: Adequate for Discharge Goal: Diagnostic test results will improve Outcome: Adequate for Discharge Goal: Respiratory complications will improve Outcome: Adequate for Discharge Goal: Cardiovascular complication will be avoided Outcome: Adequate for Discharge   Problem: Activity: Goal: Risk for activity intolerance will decrease Outcome: Adequate for Discharge   Problem: Nutrition: Goal: Adequate nutrition will be maintained Outcome: Adequate for Discharge   Problem: Coping: Goal: Level of anxiety will decrease Outcome: Adequate for Discharge   Problem: Elimination: Goal: Will not experience complications related to bowel motility Outcome: Adequate for Discharge Goal: Will not experience complications related to urinary retention Outcome: Adequate for Discharge   Problem: Pain Managment: Goal: General experience of comfort will improve Outcome: Adequate for Discharge   Problem: Safety: Goal: Ability to remain free from injury will improve Outcome: Adequate for Discharge   Problem: Education: Goal: Ability to describe self-care measures that may prevent or decrease complications (Diabetes Survival Skills Education) will improve Outcome: Adequate for Discharge Goal: Individualized Educational Video(s) Outcome: Adequate for Discharge   Problem: Coping: Goal: Ability to adjust to condition or change in health will  improve Outcome: Adequate for Discharge   Problem: Fluid Volume: Goal: Ability to maintain a balanced intake and output will improve Outcome: Adequate for Discharge   Problem: Health Behavior/Discharge Planning: Goal: Ability to identify and utilize available resources and services will improve Outcome: Adequate for Discharge Goal: Ability to manage health-related needs will improve Outcome: Adequate for Discharge   Problem: Metabolic: Goal: Ability to maintain appropriate glucose levels will improve Outcome: Adequate for Discharge   Problem: Nutritional: Goal: Maintenance of adequate nutrition will improve Outcome: Adequate for Discharge Goal: Progress toward achieving an optimal weight will improve Outcome: Adequate for Discharge   Problem: Tissue Perfusion: Goal: Adequacy of tissue perfusion will improve Outcome: Adequate for Discharge

## 2022-10-01 NOTE — Discharge Summary (Signed)
Physician Discharge Summary  Meghan Holland OZH:[id number] DOB: 1986-01-20 DOA: 09/22/2022  PCP: Patient, No Pcp Per  Admit date: 09/22/2022 Discharge date: 10/01/2022  Admitted From: Home Discharge disposition: Home  Recommendations at discharge:  Follow-up with oncology as an outpatient.   Brief narrative: Meghan Holland is a 37 y.o. female with PMH significant for iron deficiency anemia 7/7, patient presented to the ED with complaint of abdominal cramping pain radiating to back, black stool, loss of appetite, 40 pounds weight loss in a year. In the ED, she had a low hemoglobin of 5.3, FOBT positive CTA abdomen/pelvis showed small bowel intussusception and hyperenhancing mass lesion in the central abdomen measuring 4 x 3.7 cm.   Multiple air-fluid bubbles were noted in small bowel, SBO.   She was also found to have hypoenhancing lesion in the right lobe of liver, suspicious for metastasis. General surgery was consulted. 7/9, patient underwent ex lap with right hemicolectomy   Subjective Patient was seen and examined this morning.   Lying down in bed.  Not in distress.   TPN being weaned down..  General surgery for discharge today.    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  Oncology consult appreciated.  Outpatient care planned. Postop ileus improved gradually.  TPN being weaned down. Able to tolerate regular diet.  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  Severe anemia Chronic iron deficiency anemia Likely from chronic GI blood loss due to intra-abdominal mass Hemoglobin was low at 5.3.  Ferritin level was severely low at 2. 3 units PRBC transfused so far. Hemoglobin level gradually improved.  10 this morning. Recent Labs     09/22/22 1546 09/22/22 2100 09/23/22 0020 09/25/22 0528 09/26/22 0522 09/27/22 0508 09/28/22 0351 10/01/22 0419  HGB 5.3*  --    < > 7.4* 6.4* 9.2* 8.5* 10.0*  MCV 67.4*  --    < > 76.1* 77.7* 80.2 81.2 83.2  VITAMINB12  --  250  --   --   --   --   --   --   FOLATE  --  7.9  --   --   --   --   --   --   FERRITIN  --  2*  --   --   --   --   --   --   TIBC  --  508*  --   --   --   --   --   --   IRON  --  5*  --   --   --   --   --   --   RETICCTPCT 2.3  --   --   --   --   --   --   --    < > = values in this interval not displayed.   Hypokalemia/hypomagnesemia Potassium and magnesium level improved with replacement.  Superficial venous thrombosis PICC line placed on right arm on 7/10.  Patient started noticing swelling on the right arm in few hours.  Ultrasound duplex were obtained next morning which showed superficial venous thrombosis on cephalic vein distally and also around the PICC line.   Findings were discussed with general surgery team.  Could not be started on anticoagulation because of anemia.  PICC line was continued to used without issues.  PICC line pulled out  prior to discharge today.  Goals of care   Code Status: Full Code   Wounds:  - Incision (Closed) 09/24/22 Abdomen Other (Comment) (Active)  Date First Assessed/Time First Assessed: 09/24/22 1328   Location: Abdomen  Location Orientation: Other (Comment)    Assessments 09/24/2022  2:08 PM 09/30/2022  7:51 PM  Dressing Type Honeycomb Gauze (Comment)  Dressing Clean, Dry, Intact Clean, Dry, Intact  Site / Wound Assessment Dressing in place / Unable to assess Clean;Dry  Drainage Amount None None     No associated orders.    Discharge Exam:   Vitals:   09/30/22 0511 09/30/22 1422 09/30/22 2110 10/01/22 0607  BP: 98/66 102/73 101/69 105/72  Pulse: 85 96 95 91  Resp: 16 (!) 22 20 20   Temp: 98 F (36.7 C) 98.8 F (37.1 C) 98.2 F (36.8 C) 98 F (36.7 C)  TempSrc: Oral Oral Oral Oral  SpO2: 100%  100% 100% 100%  Weight: 47.6 kg   47.6 kg  Height:        Body mass index is 15.96 kg/m.   General exam: Pleasant young African-American female.  Thin built. Skin: No rashes, lesions or ulcers. HEENT: Atraumatic, normocephalic, no obvious bleeding Lungs: Clear to auscultation bilaterally CVS: Regular rate and rhythm, no murmur GI/Abd soft, appropriate postop tenderness, bowel sound present CNS: Alert, awake, oriented x 3 Psychiatry: Mood appropriate Extremities: No pedal edema, no calf tenderness.  Improving swelling right arm  Follow ups:    Follow-up Information     Berna Bue, MD. Go on 10/24/2022.   Specialty: General Surgery Why: 9:00 AM, please arrive 30 min prior to appointment time to check in. Contact information: [address] [place] [address] [phone]         [facility] Follow up.   Contact information: [address] [place] [address] [phone]        Ladene Artist, MD Follow up.   Specialty: Oncology Contact information: [address] [place] [address] [phone]                 Discharge Instructions:   Discharge Instructions     Call MD for:   Complete by: As directed    FEVER > 101.5 F  (temperatures < 101.5 F are not significant)   Call MD for:  difficulty breathing, headache or visual disturbances   Complete by: As directed    Call MD for:  extreme fatigue   Complete by: As directed    Call MD for:  extreme fatigue   Complete by: As directed    Call MD for:  hives   Complete by: As directed    Call MD for:  persistant dizziness or light-headedness   Complete by: As directed    Call MD for:  persistant dizziness or light-headedness   Complete by: As directed    Call MD for:  persistant nausea and vomiting   Complete by: As directed    Call MD for:  persistant nausea and vomiting   Complete by: As directed     Call MD for:  redness, tenderness, or signs of infection (pain, swelling, redness, odor or green/yellow discharge around incision site)   Complete by: As directed    Call MD for:  severe uncontrolled pain   Complete by: As directed    Call MD for:  severe uncontrolled pain   Complete by: As directed  Call MD for:  temperature >100.4   Complete by: As directed    Diet - low sodium heart healthy   Complete by: As directed    Start with a bland diet such as soups, liquids, starchy foods, low fat foods, etc. the first few days at home. Gradually advance to a solid, low-fat, high fiber diet by the end of the first week at home.   Add a fiber supplement to your diet (Metamucil, etc) If you feel full, bloated, or constipated, stay on a full liquid or pureed/blenderized diet for a few days until you feel better and are no longer constipated.   Diet general   Complete by: As directed    Discharge instructions   Complete by: As directed    See Discharge Instructions If you are not getting better after two weeks or are noticing you are getting worse, contact our office [phone] for further advice.  We may need to adjust your medications, re-evaluate you in the office, send you to the emergency room, or see what other things we can do to help. The clinic staff is available to answer your questions during regular business hours (8:30am-5pm).  Please don't hesitate to call and ask to speak to one of our nurses for clinical concerns.    A surgeon from [facility] is always on call at the hospitals 24 hours/day If you have a medical emergency, go to the nearest emergency room or call 911.   Discharge wound care:   Complete by: As directed    It is good for closed incisions and even open wounds to be washed every day.  Shower every day.  Short baths are fine.  Wash the incisions and wounds clean with soap & water.    You may leave closed incisions open to air if it is dry.   You may  cover the incision with clean gauze & replace it after your daily shower for comfort.   Discharge wound care:   Complete by: As directed    Driving Restrictions   Complete by: As directed    You may drive when: - you are no longer taking narcotic prescription pain medication - you can comfortably wear a seatbelt - you can safely make sudden turns/stops without pain.   Increase activity slowly   Complete by: As directed    Start light daily activities --- self-care, walking, climbing stairs- beginning the day after surgery.  Gradually increase activities as tolerated.  Control your pain to be active.  Stop when you are tired.  Ideally, walk several times a day, eventually an hour a day.   Most people are back to most day-to-day activities in a few weeks.  It takes 4-6 weeks to get back to unrestricted, intense activity. If you can walk 30 minutes without difficulty, it is safe to try more intense activity such as jogging, treadmill, bicycling, low-impact aerobics, swimming, etc. Save the most intensive and strenuous activity for last (Usually 4-8 weeks after surgery) such as sit-ups, heavy lifting, contact sports, etc.  Refrain from any intense heavy lifting or straining until you are off narcotics for pain control.  You will have off days, but things should improve week-by-week. DO NOT PUSH THROUGH PAIN.  Let pain be your guide: If it hurts to do something, don't do it.   Increase activity slowly   Complete by: As directed    Lifting restrictions   Complete by: As directed    If you can  walk 30 minutes without difficulty, it is safe to try more intense activity such as jogging, treadmill, bicycling, low-impact aerobics, swimming, etc. Save the most intensive and strenuous activity for last (Usually 4-8 weeks after surgery) such as sit-ups, heavy lifting, contact sports, etc.   Refrain from any intense heavy lifting or straining until you are off narcotics for pain control.  You will have off  days, but things should improve week-by-week. DO NOT PUSH THROUGH PAIN.  Let pain be your guide: If it hurts to do something, don't do it.  Pain is your body warning you to avoid that activity for another week until the pain goes down.   May shower / Bathe   Complete by: As directed    May walk up steps   Complete by: As directed    Remove dressing in 72 hours   Complete by: As directed    Make sure all dressings are removed by the third day after surgery.  Leave incisions open to air.  OK to cover incisions with gauze or bandages as desired   Sexual Activity Restrictions   Complete by: As directed    You may have sexual intercourse when it is comfortable. If it hurts to do something, stop.       Discharge Medications:   Allergies as of 10/01/2022   No Known Allergies      Medication List     TAKE these medications    acetaminophen 500 MG tablet Commonly known as: TYLENOL Take 1 tablet (500 mg total) by mouth every 6 (six) hours as needed.   oxyCODONE 5 MG immediate release tablet Commonly known as: Oxy IR/ROXICODONE Take 1-2 tablets (5-10 mg total) by mouth every 6 (six) hours as needed for moderate pain or breakthrough pain.               Discharge Care Instructions  (From admission, onward)           Start     Ordered   10/01/22 0000  Discharge wound care:       Comments: It is good for closed incisions and even open wounds to be washed every day.  Shower every day.  Short baths are fine.  Wash the incisions and wounds clean with soap & water.    You may leave closed incisions open to air if it is dry.   You may cover the incision with clean gauze & replace it after your daily shower for comfort.   10/01/22 0757   10/01/22 0000  Discharge wound care:        10/01/22 1109             The results of significant diagnostics from this hospitalization (including imaging, microbiology, ancillary and laboratory) are listed below for reference.     Procedures and Diagnostic Studies:   CT ANGIO GI BLEED  Result Date: 09/22/2022 CLINICAL DATA:  Mesenteric ischemia, abdominal pain radiating to back, nausea, vomiting, diarrhea * Tracking Code: BO * EXAM: CTA ABDOMEN AND PELVIS WITHOUT AND WITH CONTRAST TECHNIQUE: Multidetector CT imaging of the abdomen and pelvis was performed using the standard protocol during bolus administration of intravenous contrast. Multiplanar reconstructed images and MIPs were obtained and reviewed to evaluate the vascular anatomy. RADIATION DOSE REDUCTION: This exam was performed according to the departmental dose-optimization program which includes automated exposure control, adjustment of the mA and/or kV according to patient size and/or use of iterative reconstruction technique. CONTRAST:  OMNIPAQUE IOHEXOL 350 MG/ML SOLN  COMPARISON:  None Available. FINDINGS: VASCULAR Normal contour and caliber of the abdominal aorta. No evidence of aneurysm, dissection, or other acute aortic pathology. Standard branching pattern of the abdominal aorta, with solitary bilateral renal arteries. The branch vessel origins are patent, with specific attention to the superior mesenteric artery, which is widely patent through its origin and proximal order branch vessels. Review of the MIP images confirms the above findings. NON-VASCULAR Lower chest: No acute abnormality. Hepatobiliary: Hypoenhancing subcapsular lesion of the peripheral right lobe of the liver, hepatic segment VII/VIII, measuring 3.0 x 2.5 cm (series 11, image 17). No gallstones, gallbladder wall thickening, or biliary dilatation. Pancreas: Unremarkable. No pancreatic ductal dilatation or surrounding inflammatory changes. Spleen: Normal in size without significant abnormality. Adrenals/Urinary Tract: Adrenal glands are unremarkable. Kidneys are normal, without renal calculi, solid lesion, or hydronephrosis. Bladder is unremarkable. Stomach/Bowel: Stomach is within normal limits.  Very abnormal appearance of the mid small bowel, with a long segment small bowel small bowel intussusception, and a heterogeneously hyperenhancing mass lesion about the intussuscepted lead segment in the central abdomen measuring 4.0 x 3.7 cm (series 14, image 29). Multiple air and fluid-filled, although not overtly distended loops of small bowel in proximity to this segment, however without overt bowel obstruction. Lymphatic: No significant vascular findings are present. No enlarged abdominal or pelvic lymph nodes. Reproductive: No mass or other significant abnormality. Other: No abdominal wall hernia or abnormality. Small volume free fluid throughout the abdomen. Musculoskeletal: No acute or significant osseous findings. IMPRESSION: 1. Very abnormal appearance of the mid small bowel, with a long segment small bowel intussusception, and a heterogeneously hyperenhancing mass lesion about the intussuscepted lead segment in the central abdomen measuring 4.0 x 3.7 cm. Multiple air and fluid-filled, although not overtly distended loops of small bowel in proximity to this segment, however without overt bowel obstruction. 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 2. Hypoenhancing lesion of the peripheral right lobe of the liver, incompletely characterized but suspicious for a metastasis. Recommend multiphasic contrast enhanced MR to better characterize. 3. Small volume free fluid throughout the abdomen. 4. No evidence of aortic aneurysm, dissection, or other acute aortic pathology. The branch vessel origins are patent, with specific attention to the superior mesenteric artery, which is widely patent through its origin and proximal order branch vessels. No significant atherosclerosis. Electronically Signed   By: Jearld Lesch M.D.   On: 09/22/2022 20:43     Labs:   Basic Metabolic Panel: Recent Labs  Lab 09/26/22 0522 09/27/22 0508 09/28/22 0351  09/29/22 0242 09/30/22 0300  NA 133* 135 134* 134* 133*  K 3.1* 3.8 3.8 4.2 4.3  CL 104 105 104 104 103  CO2 23 23 25 24 24   GLUCOSE 100* 108* 112* 86 98  BUN 5* <5* 8 9 11   CREATININE 0.52 0.43* 0.49 0.45 0.49  CALCIUM 7.6* 8.3* 8.0* 8.6* 8.9  MG 1.5* 2.0 2.0  --  2.0  PHOS 4.2 3.9 4.7* 4.5 3.8   GFR Estimated Creatinine Clearance: 73.1 mL/min (by C-G formula based on SCr of 0.49 mg/dL). Liver Function Tests: Recent Labs  Lab 09/26/22 0522 09/30/22 0300  AST 19 19  ALT 12 16  ALKPHOS 28* 52  BILITOT 0.5 0.6  PROT 5.3* 7.4  ALBUMIN 2.5* 3.3*   No results for input(s): "LIPASE", "AMYLASE" in the last 168 hours. No results for input(s): "AMMONIA" in the last 168 hours. Coagulation profile Recent Labs  Lab 09/27/22 0508  INR  1.1    CBC: Recent Labs  Lab 09/25/22 0528 09/26/22 0522 09/27/22 0508 09/28/22 0351 10/01/22 0419  WBC 8.4 6.9 9.4 7.6 7.1  NEUTROABS  --   --   --   --  4.7  HGB 7.4* 6.4* 9.2* 8.5* 10.0*  HCT 24.5* 21.3* 29.9* 27.6* 32.8*  MCV 76.1* 77.7* 80.2 81.2 83.2  PLT 271 248 298 283 413*   Cardiac Enzymes: No results for input(s): "CKTOTAL", "CKMB", "CKMBINDEX", "TROPONINI" in the last 168 hours. BNP: Invalid input(s): "POCBNP" CBG: Recent Labs  Lab 09/28/22 0523 09/28/22 1121 09/28/22 1736 09/28/22 2344 09/29/22 0602  GLUCAP 98 100* 109* 100* 102*   D-Dimer No results for input(s): "DDIMER" in the last 72 hours. Hgb A1c No results for input(s): "HGBA1C" in the last 72 hours. Lipid Profile Recent Labs    09/30/22 0300  TRIG 74   Thyroid function studies No results for input(s): "TSH", "T4TOTAL", "T3FREE", "THYROIDAB" in the last 72 hours.  Invalid input(s): "FREET3" Anemia work up No results for input(s): "VITAMINB12", "FOLATE", "FERRITIN", "TIBC", "IRON", "RETICCTPCT" in the last 72 hours. Microbiology Recent Results (from the past 240 hour(s))  Surgical PCR screen     Status: Abnormal   Collection Time: 09/24/22  8:39  AM   Specimen: Nasal Mucosa; Nasal Swab  Result Value Ref Range Status   MRSA, PCR NEGATIVE NEGATIVE Final   Staphylococcus aureus POSITIVE (A) NEGATIVE Final    Comment: (NOTE) The Xpert SA Assay (FDA approved for NASAL specimens in patients 75 years of age and older), is one component of a comprehensive surveillance program. It is not intended to diagnose infection nor to guide or monitor treatment. Performed at [facility], [address] [place], [place] [address]     Time coordinating discharge: 45 minutes  Signed: Seona Clemenson  [facility] 10/01/2022, 11:09 AM

## 2022-10-01 NOTE — Progress Notes (Signed)
 RA DL PICC removed per protocol per MD order. Manual pressure applied for 3 mins. Vaseline gauze, gauze, and Tegaderm applied over insertion site. No bleeding or swelling noted. Instructed patient to remain in bed for thirty mins. Educated patient about S/S of infection and when to call MD; no heavy lifting or pressure on right side for 24 hours; keep dressing dry and intact for 24 hours. Pt verbalized comprehension.

## 2022-10-02 ENCOUNTER — Encounter: Payer: Self-pay | Admitting: *Deleted

## 2022-10-02 NOTE — Progress Notes (Signed)
PATIENT NAVIGATOR PROGRESS NOTE  Name: Meghan Holland Date: 10/02/2022 MRN: [id number]  DOB: [birth year]   Reason for visit:  PET scan scheduling and Dr Truett Perna appt review  Comments:  Discussed PET scan instructions with [name] and F/U appt with Dr Truett Perna  Reviewed directions to building and parking.   Gave contact information to call with any questions or concerns    Time spent counseling/coordinating care: 30-45 minutes

## 2022-10-09 ENCOUNTER — Ambulatory Visit: Payer: Medicaid Other | Admitting: Nurse Practitioner

## 2022-10-18 ENCOUNTER — Encounter (HOSPITAL_COMMUNITY)
Admission: RE | Admit: 2022-10-18 | Discharge: 2022-10-18 | Disposition: A | Discharge status: Home / Self Care | Payer: Medicaid Other | Source: Ambulatory Visit | Attending: Oncology | Admitting: Oncology

## 2022-10-18 DIAGNOSIS — C7A012 Malignant carcinoid tumor of the ileum: Secondary | ICD-10-CM | POA: Diagnosis present

## 2022-10-21 ENCOUNTER — Inpatient Hospital Stay: Payer: Medicaid Other

## 2022-10-21 ENCOUNTER — Inpatient Hospital Stay: Payer: Medicaid Other | Attending: Nurse Practitioner | Admitting: Nurse Practitioner

## 2022-10-21 ENCOUNTER — Ambulatory Visit: Payer: Medicaid Other | Admitting: Oncology

## 2022-10-21 ENCOUNTER — Encounter: Payer: Self-pay | Admitting: Nurse Practitioner

## 2022-10-21 VITALS — BP 118/71 | HR 97 | Temp 98.2°F | Resp 18 | Ht 68.0 in | Wt 125.2 lb

## 2022-10-21 DIAGNOSIS — Z9049 Acquired absence of other specified parts of digestive tract: Secondary | ICD-10-CM | POA: Insufficient documentation

## 2022-10-21 DIAGNOSIS — D509 Iron deficiency anemia, unspecified: Secondary | ICD-10-CM | POA: Diagnosis not present

## 2022-10-21 DIAGNOSIS — C7A012 Malignant carcinoid tumor of the ileum: Secondary | ICD-10-CM

## 2022-10-21 LAB — CBC WITH DIFFERENTIAL (CANCER CENTER ONLY)
Abs Immature Granulocytes: 0.03 10*3/uL (ref 0.00–0.07)
Basophils Absolute: 0 10*3/uL (ref 0.0–0.1)
Basophils Relative: 1 %
Eosinophils Absolute: 0.1 10*3/uL (ref 0.0–0.5)
Eosinophils Relative: 2 %
HCT: 32.7 % — ABNORMAL LOW (ref 36.0–46.0)
Hemoglobin: 10 g/dL — ABNORMAL LOW (ref 12.0–15.0)
Immature Granulocytes: 1 %
Lymphocytes Relative: 18 %
Lymphs Abs: 1.1 10*3/uL (ref 0.7–4.0)
MCH: 26.9 pg (ref 26.0–34.0)
MCHC: 30.6 g/dL (ref 30.0–36.0)
MCV: 87.9 fL (ref 80.0–100.0)
Monocytes Absolute: 0.7 10*3/uL (ref 0.1–1.0)
Monocytes Relative: 11 %
Neutro Abs: 4.4 10*3/uL (ref 1.7–7.7)
Neutrophils Relative %: 67 %
Platelet Count: 298 10*3/uL (ref 150–400)
RBC: 3.72 MIL/uL — ABNORMAL LOW (ref 3.87–5.11)
RDW: 25.8 % — ABNORMAL HIGH (ref 11.5–15.5)
WBC Count: 6.4 10*3/uL (ref 4.0–10.5)
nRBC: 0 % (ref 0.0–0.2)

## 2022-10-21 LAB — FERRITIN: Ferritin: 52 ng/mL (ref 11–307)

## 2022-10-21 NOTE — Progress Notes (Signed)
[facility] OFFICE PROGRESS NOTE   Diagnosis: Carcinoid tumor  INTERVAL HISTORY:   Meghan Holland returns for her first outpatient follow-up visit.  She feels well.  No abdominal pain.  Bowels are moving.  She has a good appetite.  She is gaining weight.  Objective:  Vital signs in last 24 hours:  Blood pressure 118/71, pulse 97, temperature 98.2 F (36.8 C), temperature source Oral, resp. rate 18, height 5\' 8"  (1.727 m), weight 125 lb 3.2 oz (56.8 kg), last menstrual period 09/22/2022, SpO2 100%.    Resp: Lungs clear bilaterally. Cardio: Regular rate and rhythm. GI: Abdomen soft and nontender.  No hepatosplenomegaly.  Healed midline surgical incision. Vascular: No leg edema.   Lab Results:  Lab Results  Component Value Date   WBC 7.1 10/01/2022   HGB 10.0 (L) 10/01/2022   HCT 32.8 (L) 10/01/2022   MCV 83.2 10/01/2022   PLT 413 (H) 10/01/2022   NEUTROABS 4.7 10/01/2022    Imaging:  No results found.  Medications: I have reviewed the patient's current medications.  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,pT2pN2 4.3 cm, tumor extends into muscularis propria, positive lymphovascular invasion, 5/28 lymph nodes positive, negative resection margins, mitotic rate less than 2/10 high-powered fields, Ki-67 less than 1%, largest metastatic mesenteric lymph node 2.8 cm at the mesenteric margin CT angiogram GI bleed 09/22/2022-long segment small bowel intussusception with a hyperenhancing mass at the intussuscepted lead segment, 4 x 3.7 cm, hypoenhancing segment 7/8  3 x 2.5 cm lesion CT chest 09/27/2022-no lung nodules, no lymphadenopathy, small bilateral pleural effusions 2.8 cm low-density right liver lesion Dotatate PET scan 10/18/2022-no signs of residual tracer avid small bowel tumor.  2 tracer avid lesions within segment 7 of the liver.  No evidence of metastatic disease elsewhere. 2.  Ileocolonic  intussusception secondary to #1 3.  Iron deficiency anemia 4.  Weight loss secondary to #1-improved 10/21/2022.  Disposition: Meghan Holland appears stable.  She is accompanied by her husband.  She was recently diagnosed with a well-differentiated neuroendocrine tumor (carcinoid) of the ileum.  Recent dotatate PET scan shows 2 tracer avid lesions within segment 7 of the liver.  Results/images of the scan reviewed.  Dr. Truett Perna reviewed options to include observation, monthly somatostatin analog injection, referral to Dr. Modesta Messing at [facility].  She would like to proceed with the referral to see Dr. Modesta Messing.  We will contact his office.  She will return to the lab today for a baseline chromogranin A level and follow-up of the iron deficiency anemia noted in the hospital.    She will return for follow-up in approximately 4 weeks.  Patient seen with Dr. Truett Perna.  Lonna Cobb ANP/GNP-BC   10/21/2022  1:35 PM This was a shared visit with Lonna Cobb.  Meghan Holland was interviewed and examined.  We discussed the PET findings with Meghan Holland.  We reviewed the images with her husband (the patient did not wish to see the images).  She appears to have metastatic disease involving 2 liver lesions.  There is no other evidence of metastatic disease.  We discussed treatment options including observation, somatostatin analog therapy, and resection of the liver lesions.  We explained resection of the liver lesions would most likely not be curative, but may for a significant disease-free interval.  I explained the progression free survival, but lack of overall survival benefit associated with somatostatin analog treatment.  She would like to consider resection of the liver  lesions.  We will schedule a liver MRI and refer her to Dr. Modesta Messing  I was present for greater than 50% of today's visit.  I performed medical decision making.  Mancel Bale, MD

## 2022-10-22 ENCOUNTER — Telehealth: Payer: Self-pay

## 2022-10-22 NOTE — Telephone Encounter (Signed)
The patient is scheduled for her appointment and is aware of the details.

## 2022-10-22 NOTE — Telephone Encounter (Signed)
-----   Message from Lonna Cobb sent at 10/21/2022  4:19 PM EDT ----- Please let her know Dr. Truett Perna communicated with the surgeon at [facility].  Recommends we get an MRI before the visit there.  Order is in.

## 2022-11-01 ENCOUNTER — Ambulatory Visit (HOSPITAL_BASED_OUTPATIENT_CLINIC_OR_DEPARTMENT_OTHER)
Admission: RE | Admit: 2022-11-01 | Discharge: 2022-11-01 | Disposition: A | Discharge status: Home / Self Care | Payer: Medicaid Other | Source: Ambulatory Visit | Attending: Nurse Practitioner | Admitting: Nurse Practitioner

## 2022-11-01 DIAGNOSIS — C787 Secondary malignant neoplasm of liver and intrahepatic bile duct: Secondary | ICD-10-CM | POA: Diagnosis not present

## 2022-11-01 DIAGNOSIS — C7A012 Malignant carcinoid tumor of the ileum: Secondary | ICD-10-CM | POA: Diagnosis present

## 2022-11-01 MED ORDER — GADOBUTROL 1 MMOL/ML IV SOLN
5.6000 mL | Freq: Once | INTRAVENOUS | Status: AC | PRN
  Administered 2022-11-01: 5.6 mL via INTRAVENOUS
  Filled 2022-11-01: qty 6

## 2022-11-04 ENCOUNTER — Other Ambulatory Visit: Payer: Self-pay

## 2022-11-04 ENCOUNTER — Other Ambulatory Visit (HOSPITAL_COMMUNITY): Payer: Self-pay

## 2022-11-04 MED ORDER — NEOMYCIN SULFATE 500 MG PO TABS
1000.0000 mg | ORAL_TABLET | ORAL | 0 refills | Status: DC
Start: 2022-11-04 — End: 2023-02-11
  Filled 2022-11-04: qty 6, 1d supply, fill #0

## 2022-11-04 MED ORDER — METRONIDAZOLE 500 MG PO TABS
500.0000 mg | ORAL_TABLET | ORAL | 0 refills | Status: DC
  Filled 2022-11-04: qty 3, 1d supply, fill #0

## 2022-11-21 ENCOUNTER — Other Ambulatory Visit: Payer: Self-pay

## 2022-11-21 ENCOUNTER — Other Ambulatory Visit (HOSPITAL_BASED_OUTPATIENT_CLINIC_OR_DEPARTMENT_OTHER): Payer: Medicaid Other

## 2022-11-21 ENCOUNTER — Inpatient Hospital Stay: Payer: Medicaid Other | Attending: Nurse Practitioner | Admitting: Oncology

## 2022-11-21 VITALS — BP 116/82 | HR 75 | Temp 98.1°F | Resp 18 | Ht 68.0 in | Wt 149.0 lb

## 2022-11-21 DIAGNOSIS — D509 Iron deficiency anemia, unspecified: Secondary | ICD-10-CM | POA: Diagnosis not present

## 2022-11-21 DIAGNOSIS — C7B02 Secondary carcinoid tumors of liver: Secondary | ICD-10-CM | POA: Diagnosis present

## 2022-11-21 DIAGNOSIS — C7A012 Malignant carcinoid tumor of the ileum: Secondary | ICD-10-CM

## 2022-11-21 DIAGNOSIS — C7B01 Secondary carcinoid tumors of distant lymph nodes: Secondary | ICD-10-CM | POA: Diagnosis not present

## 2022-11-21 NOTE — Progress Notes (Signed)
[facility] OFFICE PROGRESS NOTE   Diagnosis: Carcinoid tumor  INTERVAL HISTORY:   Meghan Holland returns as scheduled.  She feels well.  No flushing.  Her bowels are functioning.  She saw Dr. Modesta Messing on 11/04/2022.  He recommends hepatectomy.  Meghan Holland is concerned about potential morbidity from the liver resection procedure.  She plans to discuss this further with Dr. Modesta Messing prior to surgery.  She has noted swelling of the right calf.  No erythema.  She had a superficial thrombosis of the right basilic vein and right cephalic vein surrounding a PICC line on 09/26/2022.  Objective:  Vital signs in last 24 hours:  Blood pressure 116/82, pulse 75, temperature 98.1 F (36.7 C), temperature source Oral, resp. rate 18, height 5\' 8"  (1.727 m), weight 149 lb (67.6 kg), SpO2 100%.   Lymphatics: No cervical, supraclavicular, axillary, or inguinal nodes Resp: Lungs clear bilaterally Cardio: Regular rate and rhythm GI: Nontender, no mass, no hepatosplenomegaly Vascular: The right calf is slightly larger than the left side.  No erythema.  No apparent swelling at the thigh or lower leg.  No erythema or swelling of the right arm.  Possible remaining palpable cord at the medial right upper arm.  No erythema or tenderness.  Lab Results:  Lab Results  Component Value Date   WBC 6.4 10/21/2022   HGB 10.0 (L) 10/21/2022   HCT 32.7 (L) 10/21/2022   MCV 87.9 10/21/2022   PLT 298 10/21/2022   NEUTROABS 4.4 10/21/2022    CMP  Lab Results  Component Value Date   NA 133 (L) 09/30/2022   K 4.3 09/30/2022   CL 103 09/30/2022   CO2 24 09/30/2022   GLUCOSE 98 09/30/2022   BUN 11 09/30/2022   CREATININE 0.49 09/30/2022   CALCIUM 8.9 09/30/2022   PROT 7.4 09/30/2022   ALBUMIN 3.3 (L) 09/30/2022   AST 19 09/30/2022   ALT 16 09/30/2022   ALKPHOS 52 09/30/2022   BILITOT 0.6 09/30/2022   GFRNONAA >60 09/30/2022   GFRAA >60 08/02/2014    Lab Results  Component Value Date   CEA1 1.1  09/27/2022     Medications: I have reviewed the patient's current medications.   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,pT2pN2 4.3 cm, tumor extends into muscularis propria, positive lymphovascular invasion, 5/28 lymph nodes positive, negative resection margins, mitotic rate less than 2/10 high-powered fields, Ki-67 less than 1%, largest metastatic mesenteric lymph node 2.8 cm at the mesenteric margin CT angiogram GI bleed 09/22/2022-long segment small bowel intussusception with a hyperenhancing mass at the intussuscepted lead segment, 4 x 3.7 cm, hypoenhancing segment 7/8  3 x 2.5 cm lesion CT chest 09/27/2022-no lung nodules, no lymphadenopathy, small bilateral pleural effusions 2.8 cm low-density right liver lesion Dotatate PET scan 10/18/2022-no signs of residual tracer avid small bowel tumor.  2 tracer avid lesions within segment 7 of the liver.  No evidence of metastatic disease elsewhere. Right liver 11/01/2018 24-2 right liver metastases measuring 1.2 x 0.8 cm in segment 6, and 3.1 x 2.2 cm in segment 7 2.  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  Iron deficiency anemia 4.  Weight loss secondary to #1-improved 10/21/2022.    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  There are 2 liver lesions on staging scans.  Dr. Lanny Cramp recommends surgical removal of the apparent liver metastases.  She understands that surgery is not likely to be curative, but may yield  a prolonged disease-free survival.  We discussed alternate treatment options including observation and initiating somatostatin analog therapy.  We discussed the possibility of future surgery after a period of treatment with a somatostatin analog.  She is concerned about the potential morbidity associated with surgery and would like to discuss this further with Dr. Modesta Messing prior to committing to surgery.  She will return for an  office visit in 3 weeks.  I have a low clinical suspicion for lower extremity DVT, but given her history of cancer, recent surgery, and an upper extremity superficial thrombus we will schedule lower extremity Dopplers.  Thornton Papas, MD  11/21/2022  10:59 AM

## 2022-11-22 ENCOUNTER — Other Ambulatory Visit: Payer: Self-pay

## 2022-11-22 ENCOUNTER — Ambulatory Visit (HOSPITAL_BASED_OUTPATIENT_CLINIC_OR_DEPARTMENT_OTHER)
Admission: RE | Admit: 2022-11-22 | Discharge: 2022-11-22 | Disposition: A | Discharge status: Home / Self Care | Payer: Medicaid Other | Source: Ambulatory Visit | Attending: Oncology | Admitting: Oncology

## 2022-11-22 DIAGNOSIS — C7A012 Malignant carcinoid tumor of the ileum: Secondary | ICD-10-CM

## 2022-11-22 NOTE — Addendum Note (Signed)
Addended by: Dimitri Ped on: 11/22/2022 08:50 AM   Modules accepted: Orders

## 2022-11-25 ENCOUNTER — Encounter: Payer: Self-pay | Admitting: *Deleted

## 2022-11-27 ENCOUNTER — Telehealth: Payer: Self-pay | Admitting: *Deleted

## 2022-11-27 NOTE — Telephone Encounter (Signed)
On 11/26/2022, [employer] Leave and Disability paperwork completed today by this nurse.  E-mailed to [facility] for provider's collaborative nurse of assigned provider to review, amend, sign.  Awaiting return with provider signature for forms staff to complete return process to claim benefit managers.     11/27/2022:    This nurse received paperwork today via e-mail signed by provider.  Successfully returned via fax [phone]).  Process completed with copy of form to H.I[employer]M. bin designated for items to be scanned.  Mailed form to [employer] address on file. [address] [place] [address] No further instructions received, actions required or performed by this nurse.

## 2022-11-28 ENCOUNTER — Other Ambulatory Visit: Payer: Self-pay

## 2022-12-12 ENCOUNTER — Inpatient Hospital Stay: Payer: Medicaid Other | Admitting: Nurse Practitioner

## 2022-12-12 ENCOUNTER — Inpatient Hospital Stay: Payer: Medicaid Other

## 2022-12-12 ENCOUNTER — Encounter: Payer: Self-pay | Admitting: Nurse Practitioner

## 2022-12-12 ENCOUNTER — Telehealth: Payer: Self-pay | Admitting: Oncology

## 2022-12-12 ENCOUNTER — Other Ambulatory Visit: Payer: Self-pay

## 2022-12-12 VITALS — BP 114/85 | HR 88 | Temp 98.1°F | Resp 18 | Ht 68.0 in | Wt 160.0 lb

## 2022-12-12 DIAGNOSIS — C7A012 Malignant carcinoid tumor of the ileum: Secondary | ICD-10-CM | POA: Diagnosis not present

## 2022-12-12 LAB — CMP (CANCER CENTER ONLY)
ALT: 17 U/L (ref 0–44)
AST: 19 U/L (ref 15–41)
Albumin: 4 g/dL (ref 3.5–5.0)
Alkaline Phosphatase: 46 U/L (ref 38–126)
Anion gap: 6 (ref 5–15)
BUN: 10 mg/dL (ref 6–20)
CO2: 25 mmol/L (ref 22–32)
Calcium: 9.1 mg/dL (ref 8.9–10.3)
Chloride: 104 mmol/L (ref 98–111)
Creatinine: 0.68 mg/dL (ref 0.44–1.00)
GFR, Estimated: >60 mL/min (ref >60)
Glucose, Bld: 92 mg/dL (ref 70–99)
Potassium: 3.7 mmol/L (ref 3.5–5.1)
Sodium: 135 mmol/L (ref 135–145)
Total Bilirubin: 0.5 mg/dL (ref 0.3–1.2)
Total Protein: 7.9 g/dL (ref 6.5–8.1)

## 2022-12-12 LAB — CBC WITH DIFFERENTIAL (CANCER CENTER ONLY)
Abs Immature Granulocytes: 0.02 10*3/uL (ref 0.00–0.07)
Basophils Absolute: 0 10*3/uL (ref 0.0–0.1)
Basophils Relative: 1 %
Eosinophils Absolute: 0.1 10*3/uL (ref 0.0–0.5)
Eosinophils Relative: 2 %
HCT: 33.1 % — ABNORMAL LOW (ref 36.0–46.0)
Hemoglobin: 10.7 g/dL — ABNORMAL LOW (ref 12.0–15.0)
Immature Granulocytes: 0 %
Lymphocytes Relative: 22 %
Lymphs Abs: 1.5 10*3/uL (ref 0.7–4.0)
MCH: 28.5 pg (ref 26.0–34.0)
MCHC: 32.3 g/dL (ref 30.0–36.0)
MCV: 88.3 fL (ref 80.0–100.0)
Monocytes Absolute: 0.5 10*3/uL (ref 0.1–1.0)
Monocytes Relative: 7 %
Neutro Abs: 4.7 10*3/uL (ref 1.7–7.7)
Neutrophils Relative %: 68 %
Platelet Count: 369 10*3/uL (ref 150–400)
RBC: 3.75 MIL/uL — ABNORMAL LOW (ref 3.87–5.11)
RDW: 12.9 % (ref 11.5–15.5)
WBC Count: 6.8 10*3/uL (ref 4.0–10.5)
nRBC: 0 % (ref 0.0–0.2)

## 2022-12-12 LAB — FERRITIN: Ferritin: 6 ng/mL — ABNORMAL LOW (ref 11–307)

## 2022-12-12 NOTE — Progress Notes (Signed)
  [facility] OFFICE PROGRESS NOTE   Diagnosis: Carcinoid tumor  INTERVAL HISTORY:   Meghan Holland returns for follow-up.  She was scheduled for a partial hepatectomy 11/26/2022.  She presented for the scheduled surgery but ultimately decided to cancel the procedure.  She has decided reschedule the surgery, now planned 01/14/2023.  She denies diarrhea.  She notes she is periodically "hot and sweaty".  Objective:  Vital signs in last 24 hours:  Blood pressure 114/85, pulse 88, temperature 98.1 F (36.7 C), temperature source Oral, resp. rate 18, height 5\' 8"  (1.727 m), weight 160 lb (72.6 kg), SpO2 100%.    HEENT: Bilateral posterior lower gumline with fractured teeth and gum ulceration. Resp: Lungs clear bilaterally. Cardio: Regular rate and rhythm. GI: Abdomen is soft, mild tenderness at the right upper abdomen.  No hepatomegaly.  Healed midline incision. Vascular: No leg edema.  Right lower leg appears larger than the left lower leg.   Lab Results:  Lab Results  Component Value Date   WBC 6.8 12/12/2022   HGB 10.7 (L) 12/12/2022   HCT 33.1 (L) 12/12/2022   MCV 88.3 12/12/2022   PLT 369 12/12/2022   NEUTROABS 4.7 12/12/2022    Imaging:  No results found.  Medications: I have reviewed the patient's current medications.  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,pT2pN2 4.3 cm, tumor extends into muscularis propria, positive lymphovascular invasion, 5/28 lymph nodes positive, negative resection margins, mitotic rate less than 2/10 high-powered fields, Ki-67 less than 1%, largest metastatic mesenteric lymph node 2.8 cm at the mesenteric margin CT angiogram GI bleed 09/22/2022-long segment small bowel intussusception with a hyperenhancing mass at the intussuscepted lead segment, 4 x 3.7 cm, hypoenhancing segment 7/8  3 x 2.5 cm lesion CT chest 09/27/2022-no lung nodules, no lymphadenopathy, small  bilateral pleural effusions 2.8 cm low-density right liver lesion Dotatate PET scan 10/18/2022-no signs of residual tracer avid small bowel tumor.  2 tracer avid lesions within segment 7 of the liver.  No evidence of metastatic disease elsewhere. Right liver 11/01/2018 24-2 right liver metastases measuring 1.2 x 0.8 cm in segment 6, and 3.1 x 2.2 cm in segment 7 2.  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  Iron deficiency anemia 4.  Weight loss secondary to #1-improved 10/21/2022.  Disposition: Meghan Holland appears stable.  She has decided to proceed with the hepatectomy and has been rescheduled to 01/14/2023.  She has fractured teeth with ulceration at the lower posterior gum lines.  We made a referral to oral surgery to evaluate.  She will return for follow-up 2 to 3 weeks after surgery.  She will contact the office in the interim with any problems.  Patient seen with Dr. Truett Perna.  Lonna Cobb ANP/GNP-BC   12/12/2022  2:10 PM This was a shared visit with Lonna Cobb.  Meghan Holland was interviewed and examined.  She has decided to proceed with the partial hepatectomy.  She has multiple fractured teeth with gum ulceration on exam today.  We will make referral to oral surgery.  We will see her after the liver resection to decide on the indication for systemic treatment of the metastatic carcinoid tumor.  Anemia has improved, but she continues to have iron deficiency.  We we will recommend she begin oral iron therapy.  I was present for greater than 50% of today's visit.  I performed medical decision making.  Mancel Bale, MD

## 2022-12-12 NOTE — Telephone Encounter (Signed)
Patient has been scheduled. Aware of appt date and time.    Follow-Up Information  Check out comments: F/u appt with [facility] mid Noverber

## 2022-12-13 ENCOUNTER — Inpatient Hospital Stay: Payer: Medicaid Other | Admitting: Licensed Clinical Social Worker

## 2022-12-13 ENCOUNTER — Telehealth: Payer: Self-pay

## 2022-12-13 DIAGNOSIS — C7A012 Malignant carcinoid tumor of the ileum: Secondary | ICD-10-CM

## 2022-12-13 NOTE — Progress Notes (Signed)
[facility] CSW Progress Note  Copywriter, advertising contacted patient by phone to discuss referral for counseling services.  Pt states she would like to schedule a counseling session.  Covering CSW informed pt the primary CSW will be out of the office through next week, but will be informed of the request.  Pt given contact details for primary CSW to book counseling upon her return to the office.      [name], LCSW Clinical Social Worker [facility]    Patient is participating in a Managed Medicaid Plan:  Yes

## 2022-12-13 NOTE — Telephone Encounter (Signed)
Patient gave very understanding and had no further questions or concerns

## 2022-12-13 NOTE — Telephone Encounter (Signed)
-----   Message from Lonna Cobb sent at 12/12/2022  3:20 PM EDT ----- Please let her know she has decreased iron stores (ferritin is low).  Recommend she begin ferrous sulfate 325 mg 1-2 times daily if she can tolerate.  Let her know about constipation and nausea please.

## 2022-12-13 NOTE — Telephone Encounter (Signed)
The patient is scheduled for their lab; however, I have not yet received any communication from the oral surgery regarding the patient. I have reached out to the oral surgery office and left a message requesting a call back to discuss the patient referral.

## 2022-12-13 NOTE — Telephone Encounter (Signed)
-----   Message from Lonna Cobb sent at 12/13/2022  8:41 AM EDT ----- Please let her know we would like to repeat a CBC in about 3 weeks, prior to surgery at [facility].  Did she get an appointment with oral surgery?

## 2022-12-19 ENCOUNTER — Inpatient Hospital Stay: Payer: Medicaid Other | Attending: Nurse Practitioner

## 2022-12-19 DIAGNOSIS — C7B02 Secondary carcinoid tumors of liver: Secondary | ICD-10-CM | POA: Insufficient documentation

## 2022-12-19 DIAGNOSIS — C7A012 Malignant carcinoid tumor of the ileum: Secondary | ICD-10-CM | POA: Insufficient documentation

## 2022-12-19 NOTE — Progress Notes (Signed)
[facility] CSW Progress Note  Clinical Child psychotherapist contacted patient by phone to schedule counseling session per her request.  Patient stated she no longer works for [employer].  Phone session scheduled for 10/7 at 1pm.    Dorothey Baseman, LCSW Clinical Social Worker [facility]    Patient is participating in a Managed Medicaid Plan:  Yes

## 2022-12-23 ENCOUNTER — Inpatient Hospital Stay: Payer: Medicaid Other

## 2022-12-23 NOTE — Progress Notes (Signed)
[facility] CSW Counseling Note  Patient was referred by medical provider. Treatment type: Individual  Presenting Concerns: Patient and/or family reports the following symptoms/concerns: anxiety Duration of problem: Since July 2024, after her surgery. Severity of problem: moderate   Orientation:oriented to person, place, time/date, situation, day of week, month of year, and year.   Affect: NA due to telephone session. Risk of harm to self or others: No plan to harm self or others  Patient and/or Family's Strengths/Protective Factors: Social connections, Social and Emotional competence, Concrete supports in place (healthy food, safe environments, etc.), Sense of purpose, and Physical Health (exercise, healthy diet, medication compliance, etc.)Active sense of humor  Average or above average intelligence  Capable of independent living  Communication skills  General fund of knowledge  Motivation for treatment/growth  Physical Health      Goals Addressed: Patient will:  Reduce symptoms of: anxiety Increase knowledge and/or ability of: self-management skills  Increase healthy adjustment to current life circumstances   Progress towards Goals: Initial   Interventions: Interventions utilized:  CBT, Motivational Interviewing, and Supportive      Assessment: Patient currently experiencing increased anxiety due to an upcoming surgery on 01/14/35.  Patient has no history of mental illness.  Her mother died from lung cancer in 20-Jan-2017.  Patient has seven sisters and two brothers, all whom live within thirty minutes of each other.  She lives with her 36 year old daughter and her daughter's father, Jomarie Longs.  She works full time from home.  She stated she prays.  Patient also walks, but does not participate in other self-care.  Explored various activities she could participate in for relaxation.  She reported no being able to have the surgery recently due to her anxiety of what could happen.  Patient  stated she has been attempting to remain in the present moment, which has helped reduce her symptoms.  Informed her of [employer] which accepts her insurance for future therapy.      Plan: Follow up with CSW: Patient will contact CSW as needed.  Patient is not currently in treatment. Behavioral recommendations: Continue walking and focusing on the present moment. Referral(s): Individual therapist       Dorothey Baseman, LCSW   Patient is participating in a Managed Medicaid Plan:  Yes

## 2022-12-24 ENCOUNTER — Other Ambulatory Visit: Payer: Self-pay | Admitting: *Deleted

## 2022-12-24 DIAGNOSIS — C7A012 Malignant carcinoid tumor of the ileum: Secondary | ICD-10-CM

## 2022-12-31 ENCOUNTER — Inpatient Hospital Stay: Payer: Medicaid Other

## 2022-12-31 DIAGNOSIS — C7A012 Malignant carcinoid tumor of the ileum: Secondary | ICD-10-CM

## 2022-12-31 DIAGNOSIS — C7B02 Secondary carcinoid tumors of liver: Secondary | ICD-10-CM | POA: Diagnosis present

## 2022-12-31 LAB — CBC WITH DIFFERENTIAL (CANCER CENTER ONLY)
Abs Immature Granulocytes: 0.01 10*3/uL (ref 0.00–0.07)
Basophils Absolute: 0 10*3/uL (ref 0.0–0.1)
Basophils Relative: 1 %
Eosinophils Absolute: 0.1 10*3/uL (ref 0.0–0.5)
Eosinophils Relative: 2 %
HCT: 31.5 % — ABNORMAL LOW (ref 36.0–46.0)
Hemoglobin: 10.2 g/dL — ABNORMAL LOW (ref 12.0–15.0)
Immature Granulocytes: 0 %
Lymphocytes Relative: 28 %
Lymphs Abs: 1.3 10*3/uL (ref 0.7–4.0)
MCH: 29.6 pg (ref 26.0–34.0)
MCHC: 32.4 g/dL (ref 30.0–36.0)
MCV: 91.3 fL (ref 80.0–100.0)
Monocytes Absolute: 0.5 10*3/uL (ref 0.1–1.0)
Monocytes Relative: 11 %
Neutro Abs: 2.8 10*3/uL (ref 1.7–7.7)
Neutrophils Relative %: 58 %
Platelet Count: 294 10*3/uL (ref 150–400)
RBC: 3.45 MIL/uL — ABNORMAL LOW (ref 3.87–5.11)
RDW: 14.4 % (ref 11.5–15.5)
WBC Count: 4.7 10*3/uL (ref 4.0–10.5)
nRBC: 0 % (ref 0.0–0.2)

## 2023-01-01 ENCOUNTER — Telehealth: Payer: Self-pay | Admitting: *Deleted

## 2023-01-01 NOTE — Telephone Encounter (Signed)
Call from Dr. Ethelene Browns office. Patient scheduled with consultation with them on 01/08/23.

## 2023-01-09 ENCOUNTER — Other Ambulatory Visit (HOSPITAL_COMMUNITY): Payer: Self-pay

## 2023-01-09 MED ORDER — AMOXICILLIN 500 MG PO CAPS
500.0000 mg | ORAL_CAPSULE | Freq: Three times a day (TID) | ORAL | 0 refills | Status: DC
  Filled 2023-01-09: qty 15, 5d supply, fill #0

## 2023-01-09 MED ORDER — HYDROCODONE-ACETAMINOPHEN 10-325 MG PO TABS
1.0000 | ORAL_TABLET | Freq: Four times a day (QID) | ORAL | 0 refills | Status: DC | PRN
Start: 2023-01-09 — End: 2023-02-11
  Filled 2023-01-09: qty 15, 4d supply, fill #0

## 2023-01-13 ENCOUNTER — Telehealth: Payer: Self-pay

## 2023-01-13 NOTE — Telephone Encounter (Signed)
-----   Message from Lonna Cobb sent at 01/01/2023 10:02 AM EDT ----- Thanks.  Can you let the oral surgery office know that she is scheduled for liver surgery on 01/14/2023.  We will need to get their office note with recommendations as soon as possible. ----- Message ----- From: Dimitri Ped, LPN Sent: [id number]/38/7564   9:36 AM EDT To: Rana Snare, NP  She is scheduled for an oral surgery appointment on January 08, 2023. ----- Message ----- From: Rana Snare, NP Sent: 12/31/2022   4:30 PM EDT To: [facility] Clinical  Has she seen oral surgery?

## 2023-02-11 ENCOUNTER — Inpatient Hospital Stay: Payer: Medicaid Other | Attending: Nurse Practitioner | Admitting: Oncology

## 2023-02-11 ENCOUNTER — Inpatient Hospital Stay: Payer: Medicaid Other

## 2023-02-11 VITALS — BP 117/80 | HR 86 | Temp 98.1°F | Resp 18 | Ht 68.0 in | Wt 171.2 lb

## 2023-02-11 DIAGNOSIS — D509 Iron deficiency anemia, unspecified: Secondary | ICD-10-CM | POA: Diagnosis not present

## 2023-02-11 DIAGNOSIS — C7B02 Secondary carcinoid tumors of liver: Secondary | ICD-10-CM | POA: Diagnosis present

## 2023-02-11 DIAGNOSIS — C7A012 Malignant carcinoid tumor of the ileum: Secondary | ICD-10-CM | POA: Diagnosis not present

## 2023-02-11 DIAGNOSIS — K561 Intussusception: Secondary | ICD-10-CM | POA: Insufficient documentation

## 2023-02-11 LAB — CBC WITH DIFFERENTIAL (CANCER CENTER ONLY)
Abs Immature Granulocytes: 0.02 10*3/uL (ref 0.00–0.07)
Basophils Absolute: 0 10*3/uL (ref 0.0–0.1)
Basophils Relative: 1 %
Eosinophils Absolute: 0.1 10*3/uL (ref 0.0–0.5)
Eosinophils Relative: 3 %
HCT: 34 % — ABNORMAL LOW (ref 36.0–46.0)
Hemoglobin: 10.9 g/dL — ABNORMAL LOW (ref 12.0–15.0)
Immature Granulocytes: 0 %
Lymphocytes Relative: 19 %
Lymphs Abs: 1.1 10*3/uL (ref 0.7–4.0)
MCH: 30 pg (ref 26.0–34.0)
MCHC: 32.1 g/dL (ref 30.0–36.0)
MCV: 93.7 fL (ref 80.0–100.0)
Monocytes Absolute: 0.4 10*3/uL (ref 0.1–1.0)
Monocytes Relative: 7 %
Neutro Abs: 4 10*3/uL (ref 1.7–7.7)
Neutrophils Relative %: 70 %
Platelet Count: 368 10*3/uL (ref 150–400)
RBC: 3.63 MIL/uL — ABNORMAL LOW (ref 3.87–5.11)
RDW: 12.9 % (ref 11.5–15.5)
WBC Count: 5.7 10*3/uL (ref 4.0–10.5)
nRBC: 0 % (ref 0.0–0.2)

## 2023-02-11 LAB — FERRITIN: Ferritin: 34 ng/mL (ref 11–307)

## 2023-02-11 NOTE — Progress Notes (Signed)
[facility] OFFICE PROGRESS NOTE   Diagnosis: Carcinoid tumor  INTERVAL HISTORY:   Meghan Holland returns as scheduled.  She underwent multiple dental extractions 01/09/2023.  She underwent a robotic assisted wedge resection of segment 6 and segment 6/7 liver lesions on 01/14/2023.  No additional lesions were identified on intraoperative ultrasound.  The pathology confirmed metastatic well-differentiated neuroendocrine tumors and involving both lesions with negative resection margins.  He was discharged in the hospital 01/16/2023.  She continues to have mild abdominal soreness.  No fever, diarrhea, or flushing spells.  She continues iron therapy.  Objective:  Vital signs in last 24 hours:  Blood pressure 117/80, pulse 86, temperature 98.1 F (36.7 C), temperature source Temporal, resp. rate 18, height 5\' 8"  (1.727 m), weight 171 lb 3.2 oz (77.7 kg), SpO2 100%.    HEENT: Status post multiple tooth extractions Lymphatics: No cervical or supraclavicular nodes Resp: Lungs clear bilaterally Cardio: Regular rate and rhythm GI: Healed surgical incisions, no hepatosplenomegaly Vascular: No leg edema   Lab Results:  Lab Results  Component Value Date   WBC 4.7 12/31/2022   HGB 10.2 (L) 12/31/2022   HCT 31.5 (L) 12/31/2022   MCV 91.3 12/31/2022   PLT 294 12/31/2022   NEUTROABS 2.8 12/31/2022    CMP  Lab Results  Component Value Date   NA 135 12/12/2022   K 3.7 12/12/2022   CL 104 12/12/2022   CO2 25 12/12/2022   GLUCOSE 92 12/12/2022   BUN 10 12/12/2022   CREATININE 0.68 12/12/2022   CALCIUM 9.1 12/12/2022   PROT 7.9 12/12/2022   ALBUMIN 4.0 12/12/2022   AST 19 12/12/2022   ALT 17 12/12/2022   ALKPHOS 46 12/12/2022   BILITOT 0.5 12/12/2022   GFRNONAA >60 12/12/2022   GFRAA >60 08/02/2014    Lab Results  Component Value Date   CEA1 1.1 09/27/2022     Medications: I have reviewed the patient's current  medications.   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,pT2pN2 4.3 cm, tumor extends into muscularis propria, positive lymphovascular invasion, 5/28 lymph nodes positive, negative resection margins, mitotic rate less than 2/10 high-powered fields, Ki-67 less than 1%, largest metastatic mesenteric lymph node 2.8 cm at the mesenteric margin CT angiogram GI bleed 09/22/2022-long segment small bowel intussusception with a hyperenhancing mass at the intussuscepted lead segment, 4 x 3.7 cm, hypoenhancing segment 7/8  3 x 2.5 cm lesion CT chest 09/27/2022-no lung nodules, no lymphadenopathy, small bilateral pleural effusions 2.8 cm low-density right liver lesion Dotatate PET scan 10/18/2022-no signs of residual tracer avid small bowel tumor.  2 tracer avid lesions within segment 7 of the liver.  No evidence of metastatic disease elsewhere. Right liver 11/01/2018 24-2 right liver metastases measuring 1.2 x 0.8 cm in segment 6, and 3.1 x 2.2 cm in segment 7 01/14/2023: Wedge resections of segment 6 and segment 6/7 liver lesions-metastatic well-differentiated neuroendocrine tumor measuring 1.9 cm and 3.8 cm, negative resection margins 2.  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  Iron deficiency anemia 4.  Weight loss secondary to #1-improved 10/21/2022. 5.  Multiple tooth extractions 01/09/2023    Disposition: Meghan Holland is recovering from the partial hepatectomy procedure.  She underwent resection of 2 metastatic liver lesions.  She is currently in clinical remission from the metastatic carcinoid tumor.  The plan is to follow her with observation.  She is scheduled for follow-up with Dr. Modesta Messing and surveillance imaging in February.  She will return  for an office visit here in March. She will return to the lab today to follow-up on the iron deficiency anemia.  Thornton Papas, MD  02/11/2023  3:09 PM

## 2023-02-12 ENCOUNTER — Telehealth: Payer: Self-pay

## 2023-02-12 ENCOUNTER — Other Ambulatory Visit: Payer: Self-pay

## 2023-02-12 DIAGNOSIS — K6389 Other specified diseases of intestine: Secondary | ICD-10-CM

## 2023-02-12 NOTE — Telephone Encounter (Signed)
Patient gave verbal understanding and had no further questions or concerns at this time

## 2023-02-12 NOTE — Telephone Encounter (Signed)
-----   Message from Thornton Papas sent at 02/11/2023  3:56 PM EST ----- Please call patient, the hemoglobin is better, continue iron, repeat CBC next visit

## 2023-02-20 ENCOUNTER — Inpatient Hospital Stay: Payer: Medicaid Other | Attending: Nurse Practitioner

## 2023-02-20 DIAGNOSIS — C7A012 Malignant carcinoid tumor of the ileum: Secondary | ICD-10-CM | POA: Diagnosis present

## 2023-02-20 DIAGNOSIS — K6389 Other specified diseases of intestine: Secondary | ICD-10-CM

## 2023-02-20 DIAGNOSIS — C7B02 Secondary carcinoid tumors of liver: Secondary | ICD-10-CM | POA: Diagnosis present

## 2023-02-20 LAB — CBC WITH DIFFERENTIAL (CANCER CENTER ONLY)
Abs Immature Granulocytes: 0.02 10*3/uL (ref 0.00–0.07)
Basophils Absolute: 0.1 10*3/uL (ref 0.0–0.1)
Basophils Relative: 1 %
Eosinophils Absolute: 0.1 10*3/uL (ref 0.0–0.5)
Eosinophils Relative: 2 %
HCT: 34.5 % — ABNORMAL LOW (ref 36.0–46.0)
Hemoglobin: 11 g/dL — ABNORMAL LOW (ref 12.0–15.0)
Immature Granulocytes: 0 %
Lymphocytes Relative: 29 %
Lymphs Abs: 1.9 10*3/uL (ref 0.7–4.0)
MCH: 29.8 pg (ref 26.0–34.0)
MCHC: 31.9 g/dL (ref 30.0–36.0)
MCV: 93.5 fL (ref 80.0–100.0)
Monocytes Absolute: 0.4 10*3/uL (ref 0.1–1.0)
Monocytes Relative: 7 %
Neutro Abs: 4 10*3/uL (ref 1.7–7.7)
Neutrophils Relative %: 61 %
Platelet Count: 332 10*3/uL (ref 150–400)
RBC: 3.69 MIL/uL — ABNORMAL LOW (ref 3.87–5.11)
RDW: 12.4 % (ref 11.5–15.5)
WBC Count: 6.5 10*3/uL (ref 4.0–10.5)
nRBC: 0 % (ref 0.0–0.2)

## 2023-02-21 ENCOUNTER — Telehealth: Payer: Self-pay

## 2023-02-21 NOTE — Telephone Encounter (Signed)
-----   Message from Thornton Papas sent at 02/20/2023  1:44 PM EST ----- Please call patient, the hemoglobin is stable, why did she have a CBC today?

## 2023-02-21 NOTE — Telephone Encounter (Signed)
Patient gave verbal understanding and had no further questions or concerns  

## 2023-03-13 ENCOUNTER — Telehealth: Payer: Self-pay | Admitting: *Deleted

## 2023-03-13 NOTE — Telephone Encounter (Signed)
Reports that on 12/25 she has developed intermittent pain in abdomen at site of liver resection. She was not able to rate the pain. Hurts with some movements and touching area. NO swelling and no fever. Admits that she has recently been more active with more lifting and twisting than usual.  Suggested this could be musculoskeletal pain and give it till Monday. It not better call back. She agrees.

## 2023-06-10 ENCOUNTER — Telehealth: Payer: Self-pay | Admitting: Oncology

## 2023-06-10 ENCOUNTER — Telehealth: Payer: Self-pay | Admitting: *Deleted

## 2023-06-10 NOTE — Telephone Encounter (Signed)
 Patient called asking to be seen for persistent swelling in RUE in brachium area that has now become more uncomfortable. No rash, redness or heat noted. Denies any recent injury. This is side where her port had been located and she reports it the same side as her previous port. Denies any SOB or cough.  Will schedule her to see NP tomorrow. Scheduling message sent.

## 2023-06-10 NOTE — Telephone Encounter (Signed)
 Left patient a vm regarding upcoming appointment

## 2023-06-11 ENCOUNTER — Inpatient Hospital Stay: Admitting: Nurse Practitioner

## 2023-06-11 NOTE — Progress Notes (Unsigned)
 Patient Care Team: Patient, No Pcp Per as PCP - General (General Practice) O'Kelley, Jacquelyne Balint, RN as Oncology Nurse Navigator Ladene Artist, MD as Consulting Physician (Oncology)   CHIEF COMPLAINT: Symptom management for RUE swelling  INTERVAL HISTORY Ms. Schuman presents for symptom management visit for RUE swelling and discomfort.   ROS   Past Medical History:  Diagnosis Date   Medical history non-contributory    Palpitations    negative echo      Past Surgical History:  Procedure Laterality Date   LAPAROTOMY N/A 09/24/2022   Procedure: EXPLORATORY LAPAROTOMY WITH RIGHT HEMICOLECTOMY;  Surgeon: Berna Bue, MD;  Location: [facility];  Service: General;  Laterality: N/A;   NO PAST SURGERIES       Outpatient Encounter Medications as of 06/12/2023  Medication Sig   ferrous sulfate 325 (65 FE) MG EC tablet Take 325 mg by mouth daily with breakfast.   No facility-administered encounter medications on file as of 06/12/2023.     There were no vitals filed for this visit. There is no height or weight on file to calculate BMI.   PHYSICAL EXAM GENERAL:alert, no distress and comfortable SKIN: no rash  EYES: sclera clear NECK: without mass LYMPH:  no palpable cervical or supraclavicular lymphadenopathy  LUNGS: clear with normal breathing effort HEART: regular rate & rhythm, no lower extremity edema ABDOMEN: abdomen soft, non-tender and normal bowel sounds NEURO: alert & oriented x 3 with fluent speech, no focal motor/sensory deficits Breast exam:  PAC without erythema    CBC    Component Value Date/Time   WBC 6.5 02/20/2023 0855   WBC 7.1 10/01/2022 0419   RBC 3.69 (L) 02/20/2023 0855   HGB 11.0 (L) 02/20/2023 0855   HCT 34.5 (L) 02/20/2023 0855   PLT 332 02/20/2023 0855   MCV 93.5 02/20/2023 0855   MCH 29.8 02/20/2023 0855   MCHC 31.9 02/20/2023 0855   RDW 12.4 02/20/2023 0855   LYMPHSABS 1.9 02/20/2023 0855   MONOABS 0.4 02/20/2023 0855   EOSABS 0.1  02/20/2023 0855   BASOSABS 0.1 02/20/2023 0855     CMP     Component Value Date/Time   NA 135 12/12/2022 1339   K 3.7 12/12/2022 1339   CL 104 12/12/2022 1339   CO2 25 12/12/2022 1339   GLUCOSE 92 12/12/2022 1339   BUN 10 12/12/2022 1339   CREATININE 0.68 12/12/2022 1339   CALCIUM 9.1 12/12/2022 1339   PROT 7.9 12/12/2022 1339   ALBUMIN 4.0 12/12/2022 1339   AST 19 12/12/2022 1339   ALT 17 12/12/2022 1339   ALKPHOS 46 12/12/2022 1339   BILITOT 0.5 12/12/2022 1339   GFRNONAA >60 12/12/2022 1339   GFRAA >60 08/02/2014 0337     ASSESSMENT & PLAN:  Well-differentiated neuroendocrine tumor of the ileum, 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,pT2pN2 4.3 cm, tumor extends into muscularis propria, positive lymphovascular invasion, 5/28 lymph nodes positive, negative resection margins, mitotic rate less than 2/10 high-powered fields, Ki-67 less than 1%, largest metastatic mesenteric lymph node 2.8 cm at the mesenteric margin CT angiogram GI bleed 09/22/2022-long segment small bowel intussusception with a hyperenhancing mass at the intussuscepted lead segment, 4 x 3.7 cm, hypoenhancing segment 7/8  3 x 2.5 cm lesion CT chest 09/27/2022-no lung nodules, no lymphadenopathy, small bilateral pleural effusions 2.8 cm low-density right liver lesion Dotatate PET scan 10/18/2022-no signs of residual tracer avid small bowel tumor.  2 tracer avid lesions within segment 7 of the liver.  No evidence of metastatic disease elsewhere. Right liver 11/01/2018 24-2 right liver metastases measuring 1.2 x 0.8 cm in segment 6, and 3.1 x 2.2 cm in segment 7 01/14/2023: Wedge resections of segment 6 and segment 6/7 liver lesions-metastatic well-differentiated neuroendocrine tumor measuring 1.9 cm and 3.8 cm, negative resection margins 2.  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  Iron deficiency anemia 4.  Weight loss secondary to #1-improved 10/21/2022. 5.  Multiple tooth extractions 01/09/2023  PLAN:  No  orders of the defined types were placed in this encounter.     All questions were answered. The patient knows to call the clinic with any problems, questions or concerns. No barriers to learning were detected. I spent *** counseling the patient face to face. The total time spent in the appointment was *** and more than 50% was on counseling, review of test results, and coordination of care.   Santiago Glad, NP-C @DATE @

## 2023-06-12 ENCOUNTER — Ambulatory Visit: Payer: Medicaid Other | Admitting: Oncology

## 2023-06-12 ENCOUNTER — Encounter: Payer: Self-pay | Admitting: Nurse Practitioner

## 2023-06-12 ENCOUNTER — Telehealth: Payer: Self-pay

## 2023-06-12 ENCOUNTER — Ambulatory Visit (HOSPITAL_BASED_OUTPATIENT_CLINIC_OR_DEPARTMENT_OTHER)
Admission: RE | Admit: 2023-06-12 | Discharge: 2023-06-12 | Disposition: A | Discharge status: Home / Self Care | Source: Ambulatory Visit | Attending: Nurse Practitioner

## 2023-06-12 ENCOUNTER — Inpatient Hospital Stay: Attending: Nurse Practitioner | Admitting: Nurse Practitioner

## 2023-06-12 ENCOUNTER — Other Ambulatory Visit: Payer: Medicaid Other

## 2023-06-12 VITALS — BP 129/90 | HR 92 | Temp 98.0°F | Resp 18 | Ht 68.0 in | Wt 204.7 lb

## 2023-06-12 DIAGNOSIS — Z9049 Acquired absence of other specified parts of digestive tract: Secondary | ICD-10-CM | POA: Insufficient documentation

## 2023-06-12 DIAGNOSIS — D509 Iron deficiency anemia, unspecified: Secondary | ICD-10-CM | POA: Insufficient documentation

## 2023-06-12 DIAGNOSIS — M7989 Other specified soft tissue disorders: Secondary | ICD-10-CM

## 2023-06-12 DIAGNOSIS — M79601 Pain in right arm: Secondary | ICD-10-CM | POA: Insufficient documentation

## 2023-06-12 DIAGNOSIS — C7B02 Secondary carcinoid tumors of liver: Secondary | ICD-10-CM | POA: Insufficient documentation

## 2023-06-12 DIAGNOSIS — C7A012 Malignant carcinoid tumor of the ileum: Secondary | ICD-10-CM | POA: Diagnosis present

## 2023-06-12 NOTE — Telephone Encounter (Signed)
 Understood, no further questions.

## 2023-06-12 NOTE — Telephone Encounter (Signed)
-----   Message from Pollyann Samples sent at 06/12/2023  2:00 PM EDT ----- Please let pt know there is no evidence of chronic or new clot in the right arm. The symptoms are likely residual from the previous blood clot from 2024. May benefit from a compression sleeve for comfort if she wants to try? Keep scheduled appts.   Thanks Lacie NP

## 2023-06-26 ENCOUNTER — Other Ambulatory Visit: Payer: Self-pay | Admitting: *Deleted

## 2023-06-26 DIAGNOSIS — C7A012 Malignant carcinoid tumor of the ileum: Secondary | ICD-10-CM

## 2023-06-26 DIAGNOSIS — D509 Iron deficiency anemia, unspecified: Secondary | ICD-10-CM

## 2023-06-30 ENCOUNTER — Inpatient Hospital Stay: Payer: Medicaid Other | Attending: Oncology

## 2023-06-30 ENCOUNTER — Inpatient Hospital Stay: Payer: Medicaid Other | Admitting: Oncology

## 2023-06-30 VITALS — BP 114/84 | HR 78 | Temp 98.2°F | Resp 18 | Ht 68.0 in | Wt 207.4 lb

## 2023-06-30 DIAGNOSIS — D509 Iron deficiency anemia, unspecified: Secondary | ICD-10-CM

## 2023-06-30 DIAGNOSIS — C7A012 Malignant carcinoid tumor of the ileum: Secondary | ICD-10-CM | POA: Insufficient documentation

## 2023-06-30 DIAGNOSIS — C7B02 Secondary carcinoid tumors of liver: Secondary | ICD-10-CM | POA: Insufficient documentation

## 2023-06-30 LAB — CBC WITH DIFFERENTIAL (CANCER CENTER ONLY)
Abs Immature Granulocytes: 0.01 10*3/uL (ref 0.00–0.07)
Basophils Absolute: 0.1 10*3/uL (ref 0.0–0.1)
Basophils Relative: 1 %
Eosinophils Absolute: 0.1 10*3/uL (ref 0.0–0.5)
Eosinophils Relative: 1 %
HCT: 35.8 % — ABNORMAL LOW (ref 36.0–46.0)
Hemoglobin: 11.6 g/dL — ABNORMAL LOW (ref 12.0–15.0)
Immature Granulocytes: 0 %
Lymphocytes Relative: 25 %
Lymphs Abs: 1.5 10*3/uL (ref 0.7–4.0)
MCH: 30.7 pg (ref 26.0–34.0)
MCHC: 32.4 g/dL (ref 30.0–36.0)
MCV: 94.7 fL (ref 80.0–100.0)
Monocytes Absolute: 0.4 10*3/uL (ref 0.1–1.0)
Monocytes Relative: 7 %
Neutro Abs: 3.9 10*3/uL (ref 1.7–7.7)
Neutrophils Relative %: 66 %
Platelet Count: 352 10*3/uL (ref 150–400)
RBC: 3.78 MIL/uL — ABNORMAL LOW (ref 3.87–5.11)
RDW: 11.8 % (ref 11.5–15.5)
WBC Count: 5.9 10*3/uL (ref 4.0–10.5)
nRBC: 0 % (ref 0.0–0.2)

## 2023-06-30 LAB — FERRITIN: Ferritin: 18 ng/mL (ref 11–307)

## 2023-06-30 NOTE — Progress Notes (Signed)
 [facility] OFFICE PROGRESS NOTE   Diagnosis: Carcinoid tumor, history of iron deficiency anemia  INTERVAL HISTORY:  Ms.  Ackroyd returns for scheduled visit.  She was seen on 06/12/2023 with right upper extremity swelling.  She was noted to have slight enlargement of the right upper extremity compared to the left side.  A Doppler revealed no evidence of DVT or SVT in the right upper extremity.  No abdominal pain, flushing, or diarrhea.  She is taking iron daily.  She is tolerating the iron well.  She has a menstrual cycle.  Objective:  Vital signs in last 24 hours:  Blood pressure 114/84, pulse 78, temperature 98.2 F (36.8 C), temperature source Temporal, resp. rate 18, height 5\' 8"  (1.727 m), weight 207 lb 6.4 oz (94.1 kg), SpO2 100%.    Lymphatics: No cervical, supraclavicular, axillary, or inguinal nodes Resp: Lungs clear bilaterally Cardio: Regular rate and rhythm GI: No mass, nontender, no hepatosplenomegaly Vascular: No leg edema Musculoskeletal: The right upper arm appears slightly larger than the left side.  No erythema, tenderness, or palpable cord  Lab Results:  Lab Results  Component Value Date   WBC 5.9 06/30/2023   HGB 11.6 (L) 06/30/2023   HCT 35.8 (L) 06/30/2023   MCV 94.7 06/30/2023   PLT 352 06/30/2023   NEUTROABS 3.9 06/30/2023    CMP  Lab Results  Component Value Date   NA 135 12/12/2022   K 3.7 12/12/2022   CL 104 12/12/2022   CO2 25 12/12/2022   GLUCOSE 92 12/12/2022   BUN 10 12/12/2022   CREATININE 0.68 12/12/2022   CALCIUM 9.1 12/12/2022   PROT 7.9 12/12/2022   ALBUMIN 4.0 12/12/2022   AST 19 12/12/2022   ALT 17 12/12/2022   ALKPHOS 46 12/12/2022   BILITOT 0.5 12/12/2022   GFRNONAA >60 12/12/2022   GFRAA >60 08/02/2014    Lab Results  Component Value Date   CEA1 1.1 09/27/2022    Lab Results  Component Value Date   INR 1.1 09/27/2022   LABPROT 14.5 09/27/2022    Imaging:  No results found.  Medications: I  have reviewed the patient's current medications.   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,pT2pN2 4.3 cm, tumor extends into muscularis propria, positive lymphovascular invasion, 5/28 lymph nodes positive, negative resection margins, mitotic rate less than 2/10 high-powered fields, Ki-67 less than 1%, largest metastatic mesenteric lymph node 2.8 cm at the mesenteric margin CT angiogram GI bleed 09/22/2022-long segment small bowel intussusception with a hyperenhancing mass at the intussuscepted lead segment, 4 x 3.7 cm, hypoenhancing segment 7/8  3 x 2.5 cm lesion CT chest 09/27/2022-no lung nodules, no lymphadenopathy, small bilateral pleural effusions 2.8 cm low-density right liver lesion Dotatate PET scan 10/18/2022-no signs of residual tracer avid small bowel tumor.  2 tracer avid lesions within segment 7 of the liver.  No evidence of metastatic disease elsewhere. Right liver 11/01/2018 24-2 right liver metastases measuring 1.2 x 0.8 cm in segment 6, and 3.1 x 2.2 cm in segment 7 01/14/2023: Wedge resections of segment 6 and segment 6/7 liver lesions-metastatic well-differentiated neuroendocrine tumor measuring 1.9 cm and 3.8 cm, negative resection margins 3. Ileocolonic intussusception secondary to #  1 4.  Iron deficiency anemia 5.  Weight loss secondary to #1-improved 10/21/2022. 6.  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  We will follow-up on the chromogranin a level from today.  She is scheduled for surveillance imaging at [facility] next month.  She will return for an office visit  in 3 months.  She continues iron.  The hemoglobin is improved.  We will follow-up on the ferritin level from today.  Coni Deep, MD  06/30/2023  3:12 PM

## 2023-07-01 ENCOUNTER — Telehealth: Payer: Self-pay | Admitting: Oncology

## 2023-07-01 LAB — CHROMOGRANIN A: Chromogranin A (ng/mL): 65.2 ng/mL (ref 0.0–101.8)

## 2023-07-01 NOTE — Telephone Encounter (Signed)
 Patient has been scheduled for follow-up visit per 06/30/23 LOS.  Pt aware of scheduled appt details.

## 2023-10-07 ENCOUNTER — Inpatient Hospital Stay: Attending: Oncology | Admitting: Oncology

## 2023-10-07 ENCOUNTER — Inpatient Hospital Stay

## 2023-10-07 VITALS — BP 121/83 | HR 78 | Temp 97.8°F | Resp 18 | Ht 68.0 in | Wt 227.0 lb

## 2023-10-07 DIAGNOSIS — K561 Intussusception: Secondary | ICD-10-CM | POA: Insufficient documentation

## 2023-10-07 DIAGNOSIS — C7A012 Malignant carcinoid tumor of the ileum: Secondary | ICD-10-CM | POA: Diagnosis not present

## 2023-10-07 DIAGNOSIS — C7B02 Secondary carcinoid tumors of liver: Secondary | ICD-10-CM | POA: Diagnosis present

## 2023-10-07 DIAGNOSIS — D509 Iron deficiency anemia, unspecified: Secondary | ICD-10-CM

## 2023-10-07 LAB — CBC WITH DIFFERENTIAL (CANCER CENTER ONLY)
Abs Immature Granulocytes: 0.01 K/uL (ref 0.00–0.07)
Basophils Absolute: 0 K/uL (ref 0.0–0.1)
Basophils Relative: 1 %
Eosinophils Absolute: 0.1 K/uL (ref 0.0–0.5)
Eosinophils Relative: 2 %
HCT: 35.3 % — ABNORMAL LOW (ref 36.0–46.0)
Hemoglobin: 11.3 g/dL — ABNORMAL LOW (ref 12.0–15.0)
Immature Granulocytes: 0 %
Lymphocytes Relative: 23 %
Lymphs Abs: 1.5 K/uL (ref 0.7–4.0)
MCH: 31 pg (ref 26.0–34.0)
MCHC: 32 g/dL (ref 30.0–36.0)
MCV: 97 fL (ref 80.0–100.0)
Monocytes Absolute: 0.5 K/uL (ref 0.1–1.0)
Monocytes Relative: 8 %
Neutro Abs: 4.1 K/uL (ref 1.7–7.7)
Neutrophils Relative %: 66 %
Platelet Count: 337 K/uL (ref 150–400)
RBC: 3.64 MIL/uL — ABNORMAL LOW (ref 3.87–5.11)
RDW: 12.1 % (ref 11.5–15.5)
WBC Count: 6.3 K/uL (ref 4.0–10.5)
nRBC: 0 % (ref 0.0–0.2)

## 2023-10-07 LAB — FERRITIN: Ferritin: 37 ng/mL (ref 11–307)

## 2023-10-07 NOTE — Progress Notes (Signed)
 [facility] OFFICE PROGRESS NOTE   Diagnosis: Carcinoid tumor  INTERVAL HISTORY:   Ms. Meghan Holland turns as scheduled.  She feels well.  No flushing or diarrhea.  She is working.  She noted a painful cutaneous nodular area at the right upper abdomen 1 week ago.  The area is no longer painful. She continues surveillance imaging with Dr. Barbaraann. Objective:  Vital signs in last 24 hours:  Blood pressure 121/83, pulse 78, temperature 97.8 F (36.6 C), temperature source Temporal, resp. rate 18, height 5' 8 (1.727 m), weight 227 lb (103 kg), SpO2 100%.   Lymphatics: No cervical, supraclavicular, axillary, or inguinal nodes Resp: Clear bilaterally Cardio: Regular rate and rhythm GI: No hepatosplenomegaly, nontender.  At the right upper abdomen 2 or 3 fingers inferior to the costal margin there is a 3 cm area of subcutaneous induration with a central head . Vascular: No leg edema   Lab Results:  Lab Results  Component Value Date   WBC 5.9 06/30/2023   HGB 11.6 (L) 06/30/2023   HCT 35.8 (L) 06/30/2023   MCV 94.7 06/30/2023   PLT 352 06/30/2023   NEUTROABS 3.9 06/30/2023    CMP  Lab Results  Component Value Date   NA 135 12/12/2022   K 3.7 12/12/2022   CL 104 12/12/2022   CO2 25 12/12/2022   GLUCOSE 92 12/12/2022   BUN 10 12/12/2022   CREATININE 0.68 12/12/2022   CALCIUM  9.1 12/12/2022   PROT 7.9 12/12/2022   ALBUMIN  4.0 12/12/2022   AST 19 12/12/2022   ALT 17 12/12/2022   ALKPHOS 46 12/12/2022   BILITOT 0.5 12/12/2022   GFRNONAA >60 12/12/2022   GFRAA >60 08/02/2014    Lab Results  Component Value Date   CEA1 1.1 09/27/2022     Medications: I have reviewed the patient's current medications.   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,pT2pN2 4.3 cm, tumor extends into muscularis propria, positive lymphovascular invasion, 5/28 lymph nodes positive, negative resection margins, mitotic rate less than 2/10 high-powered fields, Ki-67 less than 1%, largest metastatic mesenteric lymph node 2.8 cm at the mesenteric margin CT angiogram GI bleed 09/22/2022-long segment small bowel intussusception with a hyperenhancing mass at the intussuscepted lead segment, 4 x 3.7 cm, hypoenhancing segment 7/8  3 x 2.5 cm lesion CT chest 09/27/2022-no lung nodules, no lymphadenopathy, small bilateral pleural effusions 2.8 cm low-density right liver lesion Dotatate PET scan 10/18/2022-no signs of residual tracer avid small bowel tumor.  2 tracer avid lesions within segment 7 of the liver.  No evidence of metastatic disease elsewhere. Right liver 11/01/2018 24-2 right liver metastases measuring 1.2 x 0.8 cm in segment 6, and 3.1 x 2.2 cm in segment 7 01/14/2023: Wedge resections of segment 6 and segment 6/7 liver lesions-metastatic well-differentiated neuroendocrine tumor measuring 1.9 cm and 3.8 cm, negative resection margins 04/21/2023 CTs: Postsurgical changes in the liver, no new lesion, no metastatic disease 07/21/2023 CT liver: Postsurgical changes in the liver, no new lesion, no metastatic disease 3. Ileocolonic intussusception secondary to #1 4.  Iron deficiency anemia 5.  Weight loss secondary to #1-improved 10/21/2022. 6.  Multiple tooth extractions 01/09/2023     Disposition:  Ms. 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  She is scheduled for surveillance imaging at [facility] next month.  She will return for an office and lab visit in 6 months.  She is seeing her primary provider within the next few weeks to discuss again.  She will ask her primary provider to check a CBC for follow-up of iron deficiency  anemia.  We will follow-up on the chromogranin a level from today.  The cutaneous lesion at the right upper abdomen is likely an inflamed cyst.  I have a low clinical suspicion for a cutaneous metastasis.  She will ask her primary provider and Dr. Barbaraann to evaluate this.  Arley Hof, MD  10/07/2023  10:08 AM

## 2023-10-09 LAB — CHROMOGRANIN A: Chromogranin A (ng/mL): 79.7 ng/mL (ref 0.0–101.8)

## 2023-11-10 ENCOUNTER — Other Ambulatory Visit: Payer: Self-pay | Admitting: *Deleted

## 2023-11-10 DIAGNOSIS — K769 Liver disease, unspecified: Secondary | ICD-10-CM

## 2023-11-13 ENCOUNTER — Telehealth: Payer: Self-pay

## 2023-11-13 NOTE — Telephone Encounter (Signed)
 Notified the pt regarding her Disability form being completed, faxed, and confirmation received. Pt hardcopy was mailed per pt request. No questions or concerns to be noted at this time.

## 2023-11-14 ENCOUNTER — Telehealth: Payer: Self-pay | Admitting: Nurse Practitioner

## 2023-11-14 NOTE — Telephone Encounter (Signed)
Pt scheduled and appt confirmed.

## 2023-11-18 ENCOUNTER — Ambulatory Visit (HOSPITAL_COMMUNITY): Admission: RE | Admit: 2023-11-18 | Source: Ambulatory Visit

## 2023-11-24 ENCOUNTER — Ambulatory Visit (HOSPITAL_COMMUNITY)
Admission: RE | Admit: 2023-11-24 | Discharge: 2023-11-24 | Disposition: A | Discharge status: Home / Self Care | Source: Ambulatory Visit | Attending: Oncology | Admitting: Oncology

## 2023-11-24 DIAGNOSIS — K769 Liver disease, unspecified: Secondary | ICD-10-CM | POA: Diagnosis present

## 2023-11-24 MED ORDER — COPPER CU 64 DOTATATE 1 MCI/ML IV SOLN
4.0000 | Freq: Once | INTRAVENOUS | Status: AC
  Administered 2023-11-24: 4.049 via INTRAVENOUS

## 2023-11-25 ENCOUNTER — Inpatient Hospital Stay: Admitting: Nurse Practitioner

## 2023-11-25 VITALS — BP 131/84 | HR 87 | Temp 98.1°F | Resp 18 | Ht 68.0 in | Wt 232.1 lb

## 2023-11-25 DIAGNOSIS — C7A012 Malignant carcinoid tumor of the ileum: Secondary | ICD-10-CM

## 2023-11-25 NOTE — Progress Notes (Unsigned)
  [facility] OFFICE PROGRESS NOTE   Diagnosis:    INTERVAL HISTORY:   ***  Objective:  Vital signs in last 24 hours:  There were no vitals taken for this visit.    HEENT: *** Lymphatics: *** Resp: *** Cardio: *** GI: *** Vascular: *** Neuro: ***  Skin: ***    Lab Results:  Lab Results  Component Value Date   WBC 6.3 10/07/2023   HGB 11.3 (L) 10/07/2023   HCT 35.3 (L) 10/07/2023   MCV 97.0 10/07/2023   PLT 337 10/07/2023   NEUTROABS 4.1 10/07/2023    Imaging:  No results found.  Medications: I have reviewed the patient's current medications.  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,pT2pN2 4.3 cm, tumor extends into muscularis propria, positive lymphovascular invasion, 5/28 lymph nodes positive, negative resection margins, mitotic rate less than 2/10 high-powered fields, Ki-67 less than 1%, largest metastatic mesenteric lymph node 2.8 cm at the mesenteric margin CT angiogram GI bleed 09/22/2022-long segment small bowel intussusception with a hyperenhancing mass at the intussuscepted lead segment, 4 x 3.7 cm, hypoenhancing segment 7/8  3 x 2.5 cm lesion CT chest 09/27/2022-no lung nodules, no lymphadenopathy, small bilateral pleural effusions 2.8 cm low-density right liver lesion Dotatate PET scan 10/18/2022-no signs of residual tracer avid small bowel tumor.  2 tracer avid lesions within segment 7 of the liver.  No evidence of metastatic disease elsewhere. Right liver 11/01/2018 24-2 right liver metastases measuring 1.2 x 0.8 cm in segment 6, and 3.1 x 2.2 cm in segment 7 01/14/2023: Wedge resections of segment 6  and segment 6/7 liver lesions-metastatic well-differentiated neuroendocrine tumor measuring 1.9 cm and 3.8 cm, negative resection margins 04/21/2023 CTs: Postsurgical changes in the liver, no new lesion, no metastatic disease 07/21/2023 CT liver: Postsurgical changes in the liver, no new lesion, no metastatic disease 11/03/2023 CT liver-multifocal hepatic lesions concerning for metastasis. 11/24/2023 PET dotatate- 3. Ileocolonic intussusception secondary to #1 4.  Iron deficiency anemia 5.  Weight loss secondary to #1-improved 10/21/2022. 6.  Multiple tooth extractions 01/09/2023  Disposition:    Meghan Holland ANP/GNP-BC   11/25/2023  10:53 AM

## 2023-11-26 ENCOUNTER — Telehealth: Payer: Self-pay | Admitting: Nurse Practitioner

## 2023-11-26 NOTE — Telephone Encounter (Signed)
 Called PT to confirm appt. For 11/27/23.

## 2023-11-26 NOTE — Progress Notes (Signed)
 Appointment canceled -- dotatate PET result not available at time of visit

## 2023-11-26 NOTE — Progress Notes (Unsigned)
 [facility]   [phone] [phone]    Patient Care Team: Claudene Round, MD as PCP - General (Family Medicine) Nori, Sari SQUIBB, RN as Oncology Nurse Navigator Cloretta Arley NOVAK, MD as Consulting Physician (Oncology)   CHIEF COMPLAINT: Follow up scan review, h/o NET  CURRENT THERAPY: Observation   INTERVAL HISTORY Meghan Holland returns for follow up as scheduled. Doing well, no significant changes. Eating and drinking well. Denies flushing, cramping abdominal pain, diarrhea, or other specific changes.   ROS  All other systems reviewed and negative   Past Medical History:  Diagnosis Date   Medical history non-contributory    Palpitations    negative echo      Past Surgical History:  Procedure Laterality Date   LAPAROTOMY N/A 09/24/2022   Procedure: EXPLORATORY LAPAROTOMY WITH RIGHT HEMICOLECTOMY;  Surgeon: Signe Mitzie LABOR, MD;  Location: [facility] ORS;  Service: General;  Laterality: N/A;   NO PAST SURGERIES       Outpatient Encounter Medications as of 11/27/2023  Medication Sig   ferrous sulfate 325 (65 FE) MG EC tablet Take 325 mg by mouth daily with breakfast.   MAGNESIUM  GLYCINATE, MAGNESIUM , Take 200 mg by mouth daily.   No facility-administered encounter medications on file as of 11/27/2023.     Today's Vitals   11/27/23 1000 11/27/23 1040  BP:  132/87  Pulse:  85  Resp:  18  Temp:  97.8 F (36.6 C)  TempSrc:  Temporal  SpO2:  100%  Weight:  232 lb (105.2 kg)  Height:  5' 8 (1.727 m)  PainSc: 0-No pain    Body mass index is 35.28 kg/m.   ECOG PERFORMANCE STATUS: 0 - Asymptomatic  PHYSICAL EXAM GENERAL:alert, no distress and comfortable SKIN: no rash  EYES: sclera clear NECK: without mass LYMPH:  no palpable cervical or supraclavicular lymphadenopathy  LUNGS: clear with normal breathing effort HEART: regular rate & rhythm, no lower extremity edema ABDOMEN: abdomen soft, non-tender and normal bowel sounds. No palpable  hepatomegaly or mass NEURO: alert & oriented x 3 with fluent speech, no focal motor/sensory deficits   CBC    Latest Ref Rng & Units 10/07/2023    9:25 AM 06/30/2023    2:51 PM 02/20/2023    8:55 AM  CBC  WBC 4.0 - 10.5 K/uL 6.3  5.9  6.5   Hemoglobin 12.0 - 15.0 g/dL 88.6  88.3  88.9   Hematocrit 36.0 - 46.0 % 35.3  35.8  34.5   Platelets 150 - 400 K/uL 337  352  332       CMP     Latest Ref Rng & Units 12/12/2022    1:39 PM 09/30/2022    3:00 AM 09/29/2022    2:42 AM  CMP  Glucose 70 - 99 mg/dL 92  98  86   BUN 6 - 20 mg/dL 10  11  9    Creatinine 0.44 - 1.00 mg/dL 9.31  9.50  9.54   Sodium 135 - 145 mmol/L 135  133  134   Potassium 3.5 - 5.1 mmol/L 3.7  4.3  4.2   Chloride 98 - 111 mmol/L 104  103  104   CO2 22 - 32 mmol/L 25  24  24    Calcium  8.9 - 10.3 mg/dL 9.1  8.9  8.6   Total Protein 6.5 - 8.1 g/dL 7.9  7.4    Total Bilirubin 0.3 - 1.2 mg/dL 0.5  0.6  Alkaline Phos 38 - 126 U/L 46  52    AST 15 - 41 U/L 19  19    ALT 0 - 44 U/L 17  16        ASSESSMENT & PLAN:   Swelling and discomfort of RUE, h/o superficial thrombus involving the right basilic vein, mid upper arm and AC fossa, right cephalic vein, proximal upper arm to mid forearm in the setting of PICC line placement Right upper extremity Doppler 06/12/2023: No evidence of DVT or SVT Well-differentiated neuroendocrine tumor of the ileum, 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,pT2pN2 4.3 cm, tumor extends into muscularis propria, positive lymphovascular invasion, 5/28 lymph nodes positive, negative resection margins, mitotic rate less than 2/10 high-powered fields, Ki-67 less than 1%, largest metastatic mesenteric lymph node 2.8 cm at the mesenteric margin CT angiogram GI bleed 09/22/2022-long segment small bowel intussusception with a hyperenhancing mass at the intussuscepted lead segment, 4 x 3.7 cm, hypoenhancing segment 7/8  3 x 2.5 cm lesion CT chest 09/27/2022-no lung nodules, no lymphadenopathy, small  bilateral pleural effusions 2.8 cm low-density right liver lesion Dotatate PET scan 10/18/2022-no signs of residual tracer avid small bowel tumor.  2 tracer avid lesions within segment 7 of the liver.  No evidence of metastatic disease elsewhere. Right liver 11/01/2018 24-2 right liver metastases measuring 1.2 x 0.8 cm in segment 6, and 3.1 x 2.2 cm in segment 7 01/14/2023: Wedge resections of segment 6 and segment 6/7 liver lesions-metastatic well-differentiated neuroendocrine tumor measuring 1.9 cm and 3.8 cm, negative resection margins 04/21/2023 CTs: Postsurgical changes in the liver, no new lesion, no metastatic disease 07/21/2023 CT liver: Postsurgical changes in the liver, no new lesion, no metastatic disease 11/03/2023 CTs at [facility]: Changes of partial right hepatectomy, 9 mm, arterial enhancing lesion in segment 2, 1 cm lesion in segment 8, 5 mm lesion in segment 8, no retroperitoneal or mesenteric adenopathy 11/24/2023 dotatate PET: No hypermetabolic liver lesions, new cluster of right paramidline small bowel mesentery lymph nodes with increased tracer uptake-new 3.  Ileocolonic intussusception secondary to #1 4.  Iron deficiency anemia 5.  Weight loss secondary to #1-improved 10/21/2022. 6.  Multiple tooth extractions 01/09/2023   Disposition:  Ms. Schreiner appears well. We reviewed the dotatate PET scan, which is negative for new liver metastasis, but unfortunately was compared to 10/2022 rather than the most recent 10/2023 CT at [facility]. It's difficult to ascertain metastatic disease in the liver. We discussed the small new hypermetabolic abdominal lymph nodes are likely carcinoid tumor. She is asymptomatic. Observation would be appropriate but she is inclined to consider lanreotide.   We plan to consult radiology and discuss the case with Dr. Barbaraann. Ms. Madruga will f/up at [facility] next week and see us  back in 3 weeks to finalize the plan.     All questions were answered. The patient knows to call the  clinic with any problems, questions or concerns. No barriers to learning were detected.  Abbiegail Landgren K Seleta Hovland, NP 11/27/2023   This was a shared visit with Sriya Kroeze.  We reviewed the PET findings and images with Ms. Ricky.  It is difficult to review the images on our monitor due to diffuse bright uptake in the liver.  There appears to be small volume abdominal lymph node uptake.  We asked for a repeat reading on the dotatate PET to account for the segment 6 and 7 resections.  She is scheduled to follow-up with Dr. Barbaraann on 12/01/2023.  She will return for an  office visit here in 3 weeks.  We will discuss continued observation versus initiating somatostatin analog therapy.  I will contact Dr. Barbaraann with the PET findings.  I was present for greater than 50% of today's visit.  I performed Medical Decision Making.  Arvella Hof, MD

## 2023-11-27 ENCOUNTER — Encounter: Payer: Self-pay | Admitting: Nurse Practitioner

## 2023-11-27 ENCOUNTER — Inpatient Hospital Stay: Attending: Oncology | Admitting: Nurse Practitioner

## 2023-11-27 VITALS — BP 132/87 | HR 85 | Temp 97.8°F | Resp 18 | Ht 68.0 in | Wt 232.0 lb

## 2023-11-27 DIAGNOSIS — D509 Iron deficiency anemia, unspecified: Secondary | ICD-10-CM | POA: Diagnosis not present

## 2023-11-27 DIAGNOSIS — C7A012 Malignant carcinoid tumor of the ileum: Secondary | ICD-10-CM | POA: Insufficient documentation

## 2023-11-27 DIAGNOSIS — C7B02 Secondary carcinoid tumors of liver: Secondary | ICD-10-CM | POA: Insufficient documentation

## 2023-11-28 ENCOUNTER — Telehealth: Payer: Self-pay | Admitting: Oncology

## 2023-11-28 NOTE — Telephone Encounter (Signed)
 Patient has been scheduled for follow-up visit per 11/27/23 LOS.  Pt noted appt details on personal electronic device.

## 2023-12-02 ENCOUNTER — Encounter: Payer: Self-pay | Admitting: *Deleted

## 2023-12-02 NOTE — Progress Notes (Signed)
 Family member dropped off another [facility] Disability form today noting need for additional information/clarification. Completed form is scanned in media as of 11/18/23. Sent new form with cover letter to [facility] team.

## 2023-12-02 NOTE — Progress Notes (Signed)
 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 Ki67 < 3%, margins negative. Recent imaging showed new hyperenhancing lesions in the liver, but dotatate PET without correlate, though possibly small mesenteric lymph node.  Given lack of positivity on PET and discordant imaging, significance of lesions is unclear. Consensus therefore to obtain short interval dotatate PET in 3 months, holding off any any treatment at this time.  I have discussed this plan with patient and Dr Cloretta and both agree with plans.  We will see her at [facility] in 3 months with repeat dotatate PET.

## 2023-12-04 ENCOUNTER — Telehealth: Payer: Self-pay

## 2023-12-04 NOTE — Telephone Encounter (Signed)
 Notified the pt regarding her FMLA form being Corrected,faxed, and confirmation received. Pt request the her copy be mailed to her home address. No questions or concerns to be noted at this time.

## 2023-12-17 ENCOUNTER — Telehealth: Payer: Self-pay | Admitting: Nurse Practitioner

## 2023-12-17 NOTE — Telephone Encounter (Signed)
 Per the secure chat message received on 10/1 I have left a voicemail informing Meghan Holland that her 10/2 appointments have been cancelled per Lacie

## 2023-12-18 ENCOUNTER — Inpatient Hospital Stay: Admitting: Nurse Practitioner

## 2023-12-18 ENCOUNTER — Inpatient Hospital Stay

## 2024-01-27 ENCOUNTER — Encounter: Payer: Self-pay | Admitting: Nurse Practitioner

## 2024-01-30 ENCOUNTER — Other Ambulatory Visit: Payer: Self-pay | Admitting: *Deleted

## 2024-01-30 DIAGNOSIS — C7A012 Malignant carcinoid tumor of the ileum: Secondary | ICD-10-CM

## 2024-02-23 ENCOUNTER — Encounter (HOSPITAL_COMMUNITY): Admission: RE | Admit: 2024-02-23 | Source: Ambulatory Visit

## 2024-03-02 ENCOUNTER — Inpatient Hospital Stay (HOSPITAL_COMMUNITY): Admission: RE | Admit: 2024-03-02 | Discharge: 2024-03-02 | Attending: Oncology | Admitting: Oncology

## 2024-03-02 DIAGNOSIS — C7A012 Malignant carcinoid tumor of the ileum: Secondary | ICD-10-CM | POA: Insufficient documentation

## 2024-03-02 MED ORDER — COPPER CU 64 DOTATATE 1 MCI/ML IV SOLN
4.0000 | Freq: Once | INTRAVENOUS | Status: AC
  Administered 2024-03-02: 15:00:00 4.079 via INTRAVENOUS

## 2024-03-05 ENCOUNTER — Ambulatory Visit: Payer: Self-pay | Admitting: Oncology

## 2024-03-05 NOTE — Telephone Encounter (Signed)
 Notified Shantel that DOTATATE PET is negative for recurrent carcinoid disease, f/u as scheduled

## 2024-03-08 ENCOUNTER — Other Ambulatory Visit: Payer: Self-pay | Admitting: Oncology

## 2024-03-08 DIAGNOSIS — C7A012 Malignant carcinoid tumor of the ileum: Secondary | ICD-10-CM

## 2024-04-08 ENCOUNTER — Inpatient Hospital Stay: Attending: Oncology

## 2024-04-08 ENCOUNTER — Other Ambulatory Visit: Payer: Self-pay | Admitting: Nurse Practitioner

## 2024-04-08 ENCOUNTER — Inpatient Hospital Stay: Admitting: Oncology

## 2024-04-08 VITALS — BP 126/85 | HR 93 | Temp 98.1°F | Resp 18 | Ht 68.0 in | Wt 244.4 lb

## 2024-04-08 DIAGNOSIS — D509 Iron deficiency anemia, unspecified: Secondary | ICD-10-CM | POA: Diagnosis not present

## 2024-04-08 DIAGNOSIS — C7A012 Malignant carcinoid tumor of the ileum: Secondary | ICD-10-CM

## 2024-04-08 DIAGNOSIS — K561 Intussusception: Secondary | ICD-10-CM | POA: Insufficient documentation

## 2024-04-08 DIAGNOSIS — C7B02 Secondary carcinoid tumors of liver: Secondary | ICD-10-CM | POA: Diagnosis present

## 2024-04-08 LAB — BASIC METABOLIC PANEL - CANCER CENTER ONLY
Anion gap: 9 (ref 5–15)
BUN: 8 mg/dL (ref 6–20)
CO2: 26 mmol/L (ref 22–32)
Calcium: 9.5 mg/dL (ref 8.9–10.3)
Chloride: 101 mmol/L (ref 98–111)
Creatinine: 0.82 mg/dL (ref 0.44–1.00)
GFR, Estimated: >60 mL/min
Glucose, Bld: 95 mg/dL (ref 70–99)
Potassium: 3.6 mmol/L (ref 3.5–5.1)
Sodium: 135 mmol/L (ref 135–145)

## 2024-04-08 LAB — CBC WITH DIFFERENTIAL (CANCER CENTER ONLY)
Abs Immature Granulocytes: 0.02 K/uL (ref 0.00–0.07)
Basophils Absolute: 0 K/uL (ref 0.0–0.1)
Basophils Relative: 1 %
Eosinophils Absolute: 0.1 K/uL (ref 0.0–0.5)
Eosinophils Relative: 1 %
HCT: 35.8 % — ABNORMAL LOW (ref 36.0–46.0)
Hemoglobin: 11.6 g/dL — ABNORMAL LOW (ref 12.0–15.0)
Immature Granulocytes: 0 %
Lymphocytes Relative: 21 %
Lymphs Abs: 1.7 K/uL (ref 0.7–4.0)
MCH: 30.5 pg (ref 26.0–34.0)
MCHC: 32.4 g/dL (ref 30.0–36.0)
MCV: 94.2 fL (ref 80.0–100.0)
Monocytes Absolute: 0.6 K/uL (ref 0.1–1.0)
Monocytes Relative: 7 %
Neutro Abs: 5.9 K/uL (ref 1.7–7.7)
Neutrophils Relative %: 70 %
Platelet Count: 359 K/uL (ref 150–400)
RBC: 3.8 MIL/uL — ABNORMAL LOW (ref 3.87–5.11)
RDW: 12.3 % (ref 11.5–15.5)
WBC Count: 8.4 K/uL (ref 4.0–10.5)
nRBC: 0 % (ref 0.0–0.2)

## 2024-04-08 LAB — FERRITIN: Ferritin: 69 ng/mL (ref 11–307)

## 2024-04-08 NOTE — Progress Notes (Signed)
 " [facility] OFFICE PROGRESS NOTE   Diagnosis: 1 tumor  INTERVAL HISTORY:   Meghan Holland returns as scheduled.  She feels well.  Good appetite.  No diarrhea.  No flushing.  No abdominal pain.  She is scheduled for follow-up with Dr. Barbaraann in March.  Objective:  Vital signs in last 24 hours:  Blood pressure 126/85, pulse 93, temperature 98.1 F (36.7 C), temperature source Temporal, resp. rate 18, height 5' 8 (1.727 m), weight 244 lb 6.4 oz (110.9 kg), SpO2 100%.  Lymphatics: No cervical or supraclavicular nodes Resp: Lungs clear bilaterally Cardio: Regular rate and rhythm GI: Nontender, no mass, no hepatosplenomegaly Vascular: No leg edema   Lab Results:  Lab Results  Component Value Date   WBC 8.4 04/08/2024   HGB 11.6 (L) 04/08/2024   HCT 35.8 (L) 04/08/2024   MCV 94.2 04/08/2024   PLT 359 04/08/2024   NEUTROABS 5.9 04/08/2024    CMP  Lab Results  Component Value Date   NA 135 04/08/2024   K 3.6 04/08/2024   CL 101 04/08/2024   CO2 26 04/08/2024   GLUCOSE 95 04/08/2024   BUN 8 04/08/2024   CREATININE 0.82 04/08/2024   CALCIUM  9.5 04/08/2024   PROT 7.9 12/12/2022   ALBUMIN  4.0 12/12/2022   AST 19 12/12/2022   ALT 17 12/12/2022   ALKPHOS 46 12/12/2022   BILITOT 0.5 12/12/2022   GFRNONAA >60 04/08/2024   GFRAA >60 08/02/2014    Lab Results  Component Value Date   CEA1 1.1 09/27/2022    Medications: I have reviewed the patient's current medications.   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,pT2pN2 4.3 cm, tumor extends into muscularis propria, positive lymphovascular invasion, 5/28 lymph nodes  positive, negative resection margins, mitotic rate less than 2/10 high-powered fields, Ki-67 less than 1%, largest metastatic mesenteric lymph node 2.8 cm at the mesenteric margin CT angiogram GI bleed 09/22/2022-long segment small bowel intussusception with a hyperenhancing mass at the intussuscepted lead segment, 4 x 3.7 cm, hypoenhancing segment 7/8  3 x 2.5 cm lesion CT chest 09/27/2022-no lung nodules, no lymphadenopathy, small bilateral pleural effusions 2.8 cm low-density right liver lesion Dotatate PET scan 10/18/2022-no signs of residual tracer avid small bowel tumor.  2 tracer avid lesions within segment 7 of the liver.  No evidence of metastatic disease elsewhere. Right liver 11/01/2018 24-2 right liver metastases measuring 1.2 x 0.8 cm in segment 6, and 3.1 x 2.2 cm in segment 7 01/14/2023: Wedge resections of segment 6 and segment 6/7 liver lesions-metastatic well-differentiated neuroendocrine tumor measuring 1.9 cm and 3.8 cm, negative resection margins 04/21/2023 CTs: Postsurgical changes in the liver, no new lesion, no metastatic disease 07/21/2023 CT liver: Postsurgical changes in the liver, no new lesion, no metastatic disease 11/03/2023 CT[facility] at [facility]: Changes of partial right hepatectomy, 9 mm, arterial enhancing lesion in segment 2, 1 cm lesion in segment 8, 5 mm lesion in segment 8, no retroperitoneal or mesenteric adenopathy 11/24/2023 dotatate PET: No hypermetabolic liver lesions, new cluster of right paramidline small bowel mesentery lymph nodes with increased tracer uptake-new 03/02/2024 dotatate PET: No focal hepatic metastases, no intraperitoneal mass or lymphadenopathy, [facility] reading-similar tracer avid subcentimeter  mesenteric node, previously described enhancing hepatic lesions on 11/03/2023 CT without dotatate activity above background, subtle areas of asymmetric osseous activity felt to be artifactual 3.  Ileocolonic intussusception secondary to #1 4.  Iron deficiency anemia 5.  Weight  loss secondary to #1-improved 10/21/2022. 6.  Multiple tooth extractions 01/09/2023    Disposition: Meghan Holland appears stable.  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  She will undergo a restaging CT evaluation in March.  She is scheduled for follow-up with Dr. Barbaraann on 06/08/2023.  She will return for an office visit here a few days later. She continues iron, anemia has improved. Arley Hof, MD  04/08/2024  10:46 AM   "

## 2024-04-09 LAB — CHROMOGRANIN A: Chromogranin A (ng/mL): 58.2 ng/mL (ref 0.0–101.8)

## 2024-06-09 ENCOUNTER — Inpatient Hospital Stay: Admitting: Oncology
